# Patient Record
Sex: Female | Born: 1950 | Race: Black or African American | Hispanic: No | Marital: Single | Smoking: Current every day smoker
Health system: Southern US, Community
[De-identification: ages and names within clinical notes are randomized; demographics above are authoritative.]

## PROBLEM LIST (undated history)

## (undated) DIAGNOSIS — J45909 Unspecified asthma, uncomplicated: Secondary | ICD-10-CM

## (undated) DIAGNOSIS — K5900 Constipation, unspecified: Secondary | ICD-10-CM

## (undated) DIAGNOSIS — M810 Age-related osteoporosis without current pathological fracture: Secondary | ICD-10-CM

## (undated) DIAGNOSIS — Z973 Presence of spectacles and contact lenses: Secondary | ICD-10-CM

## (undated) DIAGNOSIS — M1990 Unspecified osteoarthritis, unspecified site: Secondary | ICD-10-CM

## (undated) DIAGNOSIS — H269 Unspecified cataract: Secondary | ICD-10-CM

## (undated) DIAGNOSIS — M797 Fibromyalgia: Secondary | ICD-10-CM

## (undated) DIAGNOSIS — K219 Gastro-esophageal reflux disease without esophagitis: Secondary | ICD-10-CM

## (undated) DIAGNOSIS — K635 Polyp of colon: Secondary | ICD-10-CM

## (undated) DIAGNOSIS — G43909 Migraine, unspecified, not intractable, without status migrainosus: Secondary | ICD-10-CM

## (undated) DIAGNOSIS — T7840XA Allergy, unspecified, initial encounter: Secondary | ICD-10-CM

## (undated) DIAGNOSIS — J449 Chronic obstructive pulmonary disease, unspecified: Secondary | ICD-10-CM

## (undated) DIAGNOSIS — I10 Essential (primary) hypertension: Secondary | ICD-10-CM

## (undated) DIAGNOSIS — T148XXA Other injury of unspecified body region, initial encounter: Secondary | ICD-10-CM

## (undated) HISTORY — DX: Migraine, unspecified, not intractable, without status migrainosus: G43.909

## (undated) HISTORY — PX: BREAST EXCISIONAL BIOPSY: SUR124

## (undated) HISTORY — DX: Chronic obstructive pulmonary disease, unspecified: J44.9

## (undated) HISTORY — PX: COLONOSCOPY: SHX174

## (undated) HISTORY — PX: TUBAL LIGATION: SHX77

## (undated) HISTORY — PX: CYST REMOVAL HAND: SHX6279

## (undated) HISTORY — DX: Unspecified osteoarthritis, unspecified site: M19.90

## (undated) HISTORY — DX: Fibromyalgia: M79.7

## (undated) HISTORY — PX: TONSILLECTOMY AND ADENOIDECTOMY: SHX28

## (undated) HISTORY — PX: COLONOSCOPY W/ BIOPSIES AND POLYPECTOMY: SHX1376

## (undated) HISTORY — PX: GALLBLADDER SURGERY: SHX652

## (undated) HISTORY — DX: Gastro-esophageal reflux disease without esophagitis: K21.9

## (undated) HISTORY — DX: Unspecified asthma, uncomplicated: J45.909

## (undated) HISTORY — DX: Essential (primary) hypertension: I10

## (undated) HISTORY — DX: Allergy, unspecified, initial encounter: T78.40XA

## (undated) HISTORY — DX: Polyp of colon: K63.5

## (undated) HISTORY — DX: Age-related osteoporosis without current pathological fracture: M81.0

## (undated) HISTORY — PX: CHOLECYSTECTOMY: SHX55

## (undated) HISTORY — PX: POLYPECTOMY: SHX149

## (undated) HISTORY — PX: WISDOM TOOTH EXTRACTION: SHX21

---

## 2014-12-30 ENCOUNTER — Other Ambulatory Visit: Payer: Self-pay

## 2014-12-30 DIAGNOSIS — Z1231 Encounter for screening mammogram for malignant neoplasm of breast: Secondary | ICD-10-CM

## 2015-02-08 ENCOUNTER — Ambulatory Visit
Admission: RE | Admit: 2015-02-08 | Discharge: 2015-02-08 | Disposition: A | Discharge status: Home / Self Care | Payer: Commercial Managed Care - HMO | Source: Ambulatory Visit

## 2015-02-08 DIAGNOSIS — Z1231 Encounter for screening mammogram for malignant neoplasm of breast: Secondary | ICD-10-CM

## 2016-01-17 ENCOUNTER — Other Ambulatory Visit: Payer: Self-pay | Admitting: Internal Medicine

## 2016-01-17 DIAGNOSIS — Z1231 Encounter for screening mammogram for malignant neoplasm of breast: Secondary | ICD-10-CM

## 2016-02-09 ENCOUNTER — Ambulatory Visit
Admission: RE | Admit: 2016-02-09 | Discharge: 2016-02-09 | Disposition: A | Discharge status: Home / Self Care | Payer: Commercial Managed Care - HMO | Source: Ambulatory Visit | Attending: Internal Medicine | Admitting: Internal Medicine

## 2016-02-09 DIAGNOSIS — Z1231 Encounter for screening mammogram for malignant neoplasm of breast: Secondary | ICD-10-CM

## 2016-04-12 DIAGNOSIS — M25532 Pain in left wrist: Secondary | ICD-10-CM | POA: Diagnosis not present

## 2016-04-13 ENCOUNTER — Encounter: Payer: Medicare Other | Attending: Physical Medicine & Rehabilitation | Admitting: Physical Medicine & Rehabilitation

## 2016-04-13 ENCOUNTER — Encounter: Payer: Self-pay | Admitting: Physical Medicine & Rehabilitation

## 2016-04-13 VITALS — BP 108/72 | HR 100 | Temp 98.3°F | Resp 14

## 2016-04-13 DIAGNOSIS — R269 Unspecified abnormalities of gait and mobility: Secondary | ICD-10-CM | POA: Diagnosis not present

## 2016-04-13 DIAGNOSIS — G479 Sleep disorder, unspecified: Secondary | ICD-10-CM | POA: Diagnosis not present

## 2016-04-13 DIAGNOSIS — G8929 Other chronic pain: Secondary | ICD-10-CM | POA: Diagnosis not present

## 2016-04-13 DIAGNOSIS — Z79899 Other long term (current) drug therapy: Secondary | ICD-10-CM | POA: Diagnosis not present

## 2016-04-13 DIAGNOSIS — F17200 Nicotine dependence, unspecified, uncomplicated: Secondary | ICD-10-CM | POA: Insufficient documentation

## 2016-04-13 DIAGNOSIS — Z5181 Encounter for therapeutic drug level monitoring: Secondary | ICD-10-CM

## 2016-04-13 DIAGNOSIS — M791 Myalgia, unspecified site: Secondary | ICD-10-CM

## 2016-04-13 DIAGNOSIS — M545 Low back pain: Secondary | ICD-10-CM | POA: Diagnosis not present

## 2016-04-13 DIAGNOSIS — W19XXXA Unspecified fall, initial encounter: Secondary | ICD-10-CM

## 2016-04-13 DIAGNOSIS — M797 Fibromyalgia: Secondary | ICD-10-CM | POA: Diagnosis not present

## 2016-04-13 DIAGNOSIS — Z720 Tobacco use: Secondary | ICD-10-CM

## 2016-04-13 LAB — CBC WITH DIFFERENTIAL/PLATELET
BASOS ABS: 0 10*3/uL (ref 0.0–0.2)
Basos: 0 %
EOS (ABSOLUTE): 0.3 10*3/uL (ref 0.0–0.4)
Eos: 3 %
Hematocrit: 45.6 % (ref 34.0–46.6)
Hemoglobin: 15.9 g/dL (ref 11.1–15.9)
LYMPHS ABS: 3.6 10*3/uL — AB (ref 0.7–3.1)
LYMPHS: 33 %
MCH: 31.2 pg (ref 26.6–33.0)
MCHC: 34.9 g/dL (ref 31.5–35.7)
MCV: 89 fL (ref 79–97)
Monocytes Absolute: 0.8 10*3/uL (ref 0.1–0.9)
Monocytes: 7 %
NEUTROS ABS: 6.2 10*3/uL (ref 1.4–7.0)
Neutrophils: 57 %
PLATELETS: 244 10*3/uL (ref 150–379)
RBC: 5.1 x10E6/uL (ref 3.77–5.28)
RDW: 13.2 % (ref 12.3–15.4)
WBC: 10.9 10*3/uL — AB (ref 3.4–10.8)

## 2016-04-13 MED ORDER — BACLOFEN 20 MG PO TABS: 20.0000 mg | ORAL_TABLET | Freq: Three times a day (TID) | ORAL | 0 refills | Status: DC

## 2016-04-13 MED ORDER — DULOXETINE HCL 30 MG PO CPEP: 30.0000 mg | ORAL_CAPSULE | Freq: Every day | ORAL | 1 refills | Status: DC

## 2016-04-13 NOTE — Progress Notes (Signed)
Subjective:    Patient ID: Danielle Clay, female    DOB: 12/24/1950, 66 y.o.   MRN: [id number]  HPI 66 y/o female with pmh of OA, fibromyalgia, asthma, COPD, HTN, chronic pain presents with pain all over >> low back.  Started ~2007, getting progressively worse.  Denies inciting event.  Rest improves the pain.  Activity exacerbates the pain.  Sharp pain.  Usually non-radiating.  Mediations do not work. She has associated weakness.  Pain prevents her mobility and sleep.  She has had several falls in the last year.   Pain Inventory Average Pain 8 Pain Right Now 8 My pain is sharp, tingling and aching  In the last 24 hours, has pain interfered with the following? General activity 7 Relation with others 0 Enjoyment of life 8 What TIME of day is your pain at its worst? morning, daytime Sleep (in general) Poor  Pain is worse with: walking, bending, sitting, standing and some activites Pain improves with: n/a Relief from Meds: 2  Mobility walk without assistance use a cane ability to climb steps?  no do you drive?  no Do you have any goals in this area?  yes  Function retired Do you have any goals in this area?  no  Neuro/Psych bladder control problems bowel control problems weakness numbness tremor trouble walking spasms dizziness confusion depression loss of taste or smell  Prior Studies Any changes since last visit?  no  Physicians involved in your care Any changes since last visit?  no   Family History  Problem Relation Age of Onset  . Diabetes Mother   . Anemia Mother   . HIV Sister   . Heart Problems Brother   . Asthma Brother   . HIV Brother   . HIV Brother    Social History   Social History  . Marital status: Unknown    Spouse name: N/A  . Number of children: N/A  . Years of education: N/A   Social History Main Topics  . Smoking status: Current Some Day Smoker  . Smokeless tobacco: Never Used  . Alcohol use No     Comment: "once or twice  a year wine"  . Drug use: No  . Sexual activity: No   Other Topics Concern  . None   Social History Narrative  . None   Past Surgical History:  Procedure Laterality Date  . CYST REMOVAL HAND     Under left breast  . GALLBLADDER SURGERY    . TONSILLECTOMY AND ADENOIDECTOMY     Past Medical History:  Diagnosis Date  . Allergy   . Arthritis   . Hypertension    BP 108/72   Pulse 100   Temp 98.3 F (36.8 C) (Oral)   Resp 14   SpO2 93%   Opioid Risk Score:   Fall Risk Score:  `1  Depression screen PHQ 2/9  Depression screen PHQ 2/9 04/13/2016  Decreased Interest 3  Down, Depressed, Hopeless 3  PHQ - 2 Score 6  Altered sleeping 3  Tired, decreased energy 3  Change in appetite 2  Feeling bad or failure about yourself  1  Trouble concentrating 3  Moving slowly or fidgety/restless 0  Suicidal thoughts 0  PHQ-9 Score 18     Review of Systems  Constitutional: Positive for appetite change.       Night sweats  HENT: Negative.   Eyes: Negative.   Respiratory: Positive for wheezing.        Respiratory infection  Gastrointestinal: Positive for constipation and nausea.  Endocrine: Negative.   Genitourinary: Negative.   Musculoskeletal: Negative.        Limb swelling   Allergic/Immunologic: Negative.   Neurological: Negative.   Hematological: Negative.   Psychiatric/Behavioral: Negative.   All other systems reviewed and are negative.      Objective:   Physical Exam Gen: NAD. Vital signs reviewed HENT: Normocephalic, Atraumatic Eyes: EOMI, No discharge.  Cardio: RRR. No JVD.  Pulm: B/l clear to auscultation.  Effort normal Abd: Soft, BS+ MSK:  Gait mild difficulty with balance.   TTP lumbar PSPs and gluteal muscles.  No edema.   FABERs limited due to pain with ROM.  Neuro: CN II-XII grossly intact.    Sensation intact to light touch in all LE dermatomes  Reflexes 2+ throughout  Strength  5/5 in all LE myotomes (slightly limited due to pain  inhibition)  Neg SLR Skin: Warm and Dry    Assessment & Plan:  66 y/o female with pmh of OA, fibromyalgia, asthma, COPD, HTN, chronic pain presents with pain all over >> low back.    1. Chronic low back pain  Referrals information reviewed  Meds reviewed  NCCSRS reviewed  States she had PT without benefit.  She is not doing HEP.  No Xrays on file, L-spine films ordered  Will order TENS unit  Will order Cymbalta 30mg , consider increase to 60mg  on next visit  Will increase Baclofen, consider other muscle relaxers if not effective  Will order labs to evaluate kidney/liver function  Will explore pool therapy    2. Fibromyalgia  Will order Cymbalta 30mg , consider increase to 60mg  on next visit  3. Gait abnormality with several falls resulting in fracture  Cont cane for safety  Will d/c Gabapentin as upon further investigation, pt states falls increased after increase in pills  4. Sleep disturbance  Cont trazodone per PCP  5. Myalgia  Will consider trigger point injections in future - pt afraid of needles  6. Tobacco abuse  Pt attempting to quit

## 2016-04-13 NOTE — Addendum Note (Signed)
Addended by: Marland Mcalpine B on: 04/13/2016 02:35 PM   Modules accepted: Orders

## 2016-04-14 LAB — COMPREHENSIVE METABOLIC PANEL
A/G RATIO: 1.4 (ref 1.2–2.2)
ALT: 17 IU/L (ref 0–32)
AST: 17 IU/L (ref 0–40)
Albumin: 4.2 g/dL (ref 3.6–4.8)
Alkaline Phosphatase: 71 IU/L (ref 39–117)
BUN/Creatinine Ratio: 21 (ref 12–28)
BUN: 12 mg/dL (ref 8–27)
Bilirubin Total: 0.5 mg/dL (ref 0.0–1.2)
CALCIUM: 10.6 mg/dL — AB (ref 8.7–10.3)
CO2: 26 mmol/L (ref 18–29)
Chloride: 99 mmol/L (ref 96–106)
Creatinine, Ser: 0.57 mg/dL (ref 0.57–1.00)
GFR, EST AFRICAN AMERICAN: 112 mL/min/{1.73_m2} (ref >59)
GFR, EST NON AFRICAN AMERICAN: 97 mL/min/{1.73_m2} (ref >59)
Globulin, Total: 3 g/dL (ref 1.5–4.5)
Glucose: 120 mg/dL — ABNORMAL HIGH (ref 65–99)
POTASSIUM: 4.7 mmol/L (ref 3.5–5.2)
Sodium: 140 mmol/L (ref 134–144)
TOTAL PROTEIN: 7.2 g/dL (ref 6.0–8.5)

## 2016-04-20 LAB — TOXASSURE SELECT,+ANTIDEPR,UR

## 2016-04-25 ENCOUNTER — Telehealth: Payer: Self-pay

## 2016-04-25 NOTE — Progress Notes (Signed)
Patient Urine drug screen came back Inconsistent. Urine Sample contained EtG and EtS, which indicates the presences of alcohol. Please see lab report for details and advise.  

## 2016-04-25 NOTE — Telephone Encounter (Signed)
Patient Urine drug screen came back Inconsistent. Urine Sample contained EtG and EtS, which indicates the presences of alcohol. Please see lab report for details and advise.  

## 2016-04-28 DIAGNOSIS — R296 Repeated falls: Secondary | ICD-10-CM | POA: Diagnosis not present

## 2016-04-28 DIAGNOSIS — R51 Headache: Secondary | ICD-10-CM | POA: Diagnosis not present

## 2016-04-28 DIAGNOSIS — R42 Dizziness and giddiness: Secondary | ICD-10-CM | POA: Diagnosis not present

## 2016-05-10 DIAGNOSIS — M25532 Pain in left wrist: Secondary | ICD-10-CM | POA: Diagnosis not present

## 2016-05-11 ENCOUNTER — Encounter: Payer: Medicare Other | Admitting: Physical Medicine & Rehabilitation

## 2016-05-15 ENCOUNTER — Other Ambulatory Visit: Payer: Self-pay | Admitting: Nurse Practitioner

## 2016-05-15 DIAGNOSIS — R42 Dizziness and giddiness: Secondary | ICD-10-CM

## 2016-05-15 DIAGNOSIS — R296 Repeated falls: Secondary | ICD-10-CM

## 2016-05-17 ENCOUNTER — Telehealth: Payer: Self-pay

## 2016-05-17 NOTE — Telephone Encounter (Signed)
Patient called this morning requesting help with moving prescriptions onto a mail order program.

## 2016-05-18 ENCOUNTER — Ambulatory Visit
Admission: RE | Admit: 2016-05-18 | Discharge: 2016-05-18 | Disposition: A | Discharge status: Home / Self Care | Payer: Medicare Other | Source: Ambulatory Visit | Attending: Physical Medicine & Rehabilitation | Admitting: Physical Medicine & Rehabilitation

## 2016-05-18 DIAGNOSIS — M47816 Spondylosis without myelopathy or radiculopathy, lumbar region: Secondary | ICD-10-CM | POA: Diagnosis not present

## 2016-05-18 DIAGNOSIS — G8929 Other chronic pain: Secondary | ICD-10-CM

## 2016-05-18 DIAGNOSIS — M545 Low back pain, unspecified: Secondary | ICD-10-CM

## 2016-05-18 NOTE — Telephone Encounter (Signed)
Called new pharmacy and delivered the prescriptions by phone, contacted patient and informed her of new delivery of medications

## 2016-05-22 ENCOUNTER — Ambulatory Visit
Admission: RE | Admit: 2016-05-22 | Discharge: 2016-05-22 | Disposition: A | Discharge status: Home / Self Care | Payer: Medicare Other | Source: Ambulatory Visit | Attending: Nurse Practitioner | Admitting: Nurse Practitioner

## 2016-05-22 ENCOUNTER — Inpatient Hospital Stay: Admission: RE | Admit: 2016-05-22 | Payer: Medicare Other | Source: Ambulatory Visit

## 2016-05-22 DIAGNOSIS — R42 Dizziness and giddiness: Secondary | ICD-10-CM | POA: Diagnosis not present

## 2016-05-22 DIAGNOSIS — R51 Headache: Secondary | ICD-10-CM | POA: Diagnosis not present

## 2016-05-22 DIAGNOSIS — R296 Repeated falls: Secondary | ICD-10-CM

## 2016-05-22 MED ORDER — IOPAMIDOL (ISOVUE-300) INJECTION 61%
75.0000 mL | Freq: Once | INTRAVENOUS | Status: AC | PRN
  Administered 2016-05-22: 75 mL via INTRAVENOUS

## 2016-05-23 ENCOUNTER — Telehealth: Payer: Self-pay

## 2016-05-23 NOTE — Telephone Encounter (Signed)
Patient called today asking if she could increase the amounts of her prescriptions of baclofen and Cymbalta to 90 days supplies, was informed that dr Posey Pronto recommended she stay with the current amounts of her meds until her follow up appointment as this is a trial on these medications to see if this is the best course of action for this patient

## 2016-05-26 ENCOUNTER — Ambulatory Visit: Payer: Medicare Other | Admitting: Physical Medicine & Rehabilitation

## 2016-06-07 DIAGNOSIS — S52602D Unspecified fracture of lower end of left ulna, subsequent encounter for closed fracture with routine healing: Secondary | ICD-10-CM | POA: Diagnosis not present

## 2016-06-08 ENCOUNTER — Other Ambulatory Visit: Payer: Self-pay

## 2016-06-08 ENCOUNTER — Encounter: Payer: Medicare Other | Attending: Physical Medicine & Rehabilitation | Admitting: Physical Medicine & Rehabilitation

## 2016-06-08 DIAGNOSIS — G8929 Other chronic pain: Secondary | ICD-10-CM | POA: Diagnosis not present

## 2016-06-08 DIAGNOSIS — Z720 Tobacco use: Secondary | ICD-10-CM | POA: Diagnosis not present

## 2016-06-08 DIAGNOSIS — M545 Low back pain, unspecified: Secondary | ICD-10-CM

## 2016-06-08 DIAGNOSIS — Z79899 Other long term (current) drug therapy: Secondary | ICD-10-CM | POA: Diagnosis not present

## 2016-06-08 DIAGNOSIS — M797 Fibromyalgia: Secondary | ICD-10-CM | POA: Diagnosis not present

## 2016-06-08 DIAGNOSIS — R269 Unspecified abnormalities of gait and mobility: Secondary | ICD-10-CM | POA: Insufficient documentation

## 2016-06-08 DIAGNOSIS — G479 Sleep disorder, unspecified: Secondary | ICD-10-CM | POA: Diagnosis not present

## 2016-06-08 DIAGNOSIS — F17200 Nicotine dependence, unspecified, uncomplicated: Secondary | ICD-10-CM | POA: Insufficient documentation

## 2016-06-08 DIAGNOSIS — Z5181 Encounter for therapeutic drug level monitoring: Secondary | ICD-10-CM | POA: Insufficient documentation

## 2016-06-08 DIAGNOSIS — W19XXXD Unspecified fall, subsequent encounter: Secondary | ICD-10-CM

## 2016-06-08 DIAGNOSIS — M791 Myalgia, unspecified site: Secondary | ICD-10-CM

## 2016-06-08 MED ORDER — BACLOFEN 20 MG PO TABS: 30.0000 mg | ORAL_TABLET | Freq: Three times a day (TID) | ORAL | 1 refills | Status: AC

## 2016-06-08 MED ORDER — DULOXETINE HCL 60 MG PO CPEP: 60.0000 mg | ORAL_CAPSULE | Freq: Every day | ORAL | 1 refills | Status: DC

## 2016-06-08 NOTE — Progress Notes (Addendum)
Subjective:    Patient ID: Danielle Clay, female    DOB: [birth year], 66 y.o.   MRN: [id number]  HPI 66 y/o female with pmh of OA, fibromyalgia, asthma, COPD, HTN, chronic pain presents for follow up of pain all over >> low back.  Initially stated: Started ~2007, getting progressively worse.  Denies inciting event.  Rest improves the pain.  Activity exacerbates the pain.  Sharp pain.  Usually non-radiating.  Mediations do not work. She has associated weakness.  Pain prevents her mobility and sleep.  She has had several falls in the last year.   Last clinic visit 04/13/16.  She states she is not doing HEP.  She obtained her L-spine films. She states she lost the prescription for the TENS.  Cymbalta is helping.  Baclofen is helping.  She obtained labs.  She did not explore pool therapy. She is now smoking 2-3 cigarettes/day. Since last visit, she states she fell and is scheduled to have an MRI due to continued headache.   Pain Inventory Average Pain 9 Pain Right Now 9 My pain is constant, sharp and aching  In the last 24 hours, has pain interfered with the following? General activity 7 Relation with others 0 Enjoyment of life 6 What TIME of day is your pain at its worst? all Sleep (in general) Poor  Pain is worse with: walking, bending, sitting, standing and some activites Pain improves with: other Relief from Meds: 4  Mobility walk without assistance use a cane ability to climb steps?  yes do you drive?  no  Function retired I need assistance with the following:  household duties  Neuro/Psych bladder control problems tremor spasms dizziness confusion depression loss of taste or smell  Prior Studies Any changes since last visit?  no  Physicians involved in your care Any changes since last visit?  no   Family History  Problem Relation Age of Onset  . Diabetes Mother   . Anemia Mother   . HIV Sister   . Heart Problems Brother   . Asthma Brother   . HIV Brother    . HIV Brother    Social History   Social History  . Marital status: Unknown    Spouse name: N/A  . Number of children: N/A  . Years of education: N/A   Social History Main Topics  . Smoking status: Current Some Day Smoker  . Smokeless tobacco: Never Used  . Alcohol use No     Comment: "once or twice a year wine"  . Drug use: No  . Sexual activity: No   Other Topics Concern  . Not on file   Social History Narrative  . No narrative on file   Past Surgical History:  Procedure Laterality Date  . CYST REMOVAL HAND     Under left breast  . GALLBLADDER SURGERY    . TONSILLECTOMY AND ADENOIDECTOMY     Past Medical History:  Diagnosis Date  . Allergy   . Arthritis   . Hypertension    There were no vitals taken for this visit.  Opioid Risk Score:   Fall Risk Score:  `1  Depression screen PHQ 2/9  Depression screen [facility] 2/9 06/08/2016 04/13/2016  Decreased Interest 3 3  Down, Depressed, Hopeless 3 3  PHQ - 2 Score 6 6  Altered sleeping - 3  Tired, decreased energy - 3  Change in appetite - 2  Feeling bad or failure about yourself  - 1  Trouble concentrating - 3  Moving slowly or fidgety/restless - 0  Suicidal thoughts - 0  PHQ-9 Score - 18     Review of Systems  Constitutional: Positive for appetite change.       Night sweats  HENT: Negative.   Eyes: Negative.   Respiratory: Negative.        Respiratory infection   Cardiovascular: Positive for leg swelling.  Gastrointestinal: Positive for abdominal pain, constipation, diarrhea and nausea.  Endocrine: Negative.   Genitourinary: Negative.   Musculoskeletal: Negative.        Limb swelling   Skin: Negative.   Allergic/Immunologic: Negative.   Neurological: Positive for dizziness and tremors.  Hematological: Negative.   Psychiatric/Behavioral: Positive for confusion and dysphoric mood. The patient is nervous/anxious.   All other systems reviewed and are negative.      Objective:   Physical Exam Gen:  NAD. Vital signs reviewed HENT: Normocephalic, +Traumatic Eyes: EOMI, No discharge.  Cardio: RRR. No JVD.  Pulm: B/l clear to auscultation.  Effort normal Abd: Soft, BS+ MSK:  Gait mild antalgic.   TTP lumbar PSPs and gluteal muscles.  No edema.  Neuro:    Sensation intact to light touch in all LE dermatomes  Strength  5/5 in all LE myotomes (slightly limited due to pain inhibition) Skin: Warm and Dry. Intact    Assessment & Plan:  66 y/o female with pmh of OA, fibromyalgia, asthma, COPD, HTN, chronic pain presents with pain all over >> low back.    1. Chronic low back pain  NCCSRS reviewed  States she had PT without benefit.  She is not doing HEP.  L-spine films reviewed showing multilevel degenerative changes and anterolisthesis L4-5  Labs reviewed, relatively unremarkable  Will order TENS unit again, pt states she lost prescription  Will increase Cymbalta to 60mg   Will increase Baclofen to 30 TID  Pt to explore pool therapy (again encouraged)  Will order MRI L-spine  UDS reviewed, showing Etoh    2. Fibromyalgia  Will increase Cymbalta to 60mg   3. Gait abnormality with several falls resulting in fracture  Cont cane for safety, pt states she does need all the time  Will d/c Gabapentin as upon further investigation, pt states falls increased after increase in pills  Pt to have MRI per PCP for fall with persistent headache  4. Sleep disturbance  Cont trazodone per PCP  5. Myalgia  Will consider trigger point injections in future - pt afraid of needles  6. Tobacco abuse  Pt attempting to quit, continued to encourage

## 2016-06-12 ENCOUNTER — Telehealth: Payer: Self-pay | Admitting: Physical Medicine & Rehabilitation

## 2016-06-12 NOTE — Telephone Encounter (Signed)
What medication?  I am only prescribing her Cymbalta and Baclofen, which were both refilled on the last visit.

## 2016-06-12 NOTE — Telephone Encounter (Signed)
Patient called her RX empty dr patel changed dosage and she is out please call her pharmacy at [phone] and advise of changes - her number is [phone]

## 2016-06-13 ENCOUNTER — Encounter: Payer: Self-pay | Admitting: Diagnostic Neuroimaging

## 2016-06-13 ENCOUNTER — Ambulatory Visit (INDEPENDENT_AMBULATORY_CARE_PROVIDER_SITE_OTHER): Payer: Medicare Other | Admitting: Diagnostic Neuroimaging

## 2016-06-13 ENCOUNTER — Telehealth: Payer: Self-pay | Admitting: Diagnostic Neuroimaging

## 2016-06-13 VITALS — BP 139/86 | HR 92 | Ht 64.5 in | Wt 179.6 lb

## 2016-06-13 DIAGNOSIS — R269 Unspecified abnormalities of gait and mobility: Secondary | ICD-10-CM | POA: Diagnosis not present

## 2016-06-13 DIAGNOSIS — G44329 Chronic post-traumatic headache, not intractable: Secondary | ICD-10-CM | POA: Diagnosis not present

## 2016-06-13 DIAGNOSIS — M545 Low back pain, unspecified: Secondary | ICD-10-CM

## 2016-06-13 DIAGNOSIS — G8929 Other chronic pain: Secondary | ICD-10-CM | POA: Diagnosis not present

## 2016-06-13 DIAGNOSIS — H538 Other visual disturbances: Secondary | ICD-10-CM

## 2016-06-13 MED ORDER — ALPRAZOLAM 0.5 MG PO TABS: 0.5000 mg | ORAL_TABLET | ORAL | 0 refills | Status: DC | PRN

## 2016-06-13 NOTE — Telephone Encounter (Signed)
I signed rx. Will give to RN. -VRP

## 2016-06-13 NOTE — Telephone Encounter (Signed)
Xanax Rx faxed to [facility].

## 2016-06-13 NOTE — Progress Notes (Signed)
[facility]  PATIENT: Danielle Clay DOB: [birth year]  REFERRING CLINICIAN: Minette Brine, NP HISTORY FROM: patient  REASON FOR VISIT: new consult    HISTORICAL  CHIEF COMPLAINT:  Chief Complaint  Patient presents with  . Rm 6  . New Patient (Initial Visit)  . Dizziness    Reports vertigo that started sometime in Dec. Feels unbalanced. Hx of hitting her head on concrete in Oct/Nov.  . Headache    Accompanied by headache, blurry vision and nausea. CT showed small optic nerve sheath calcification bilaterally w/o evidence of soft tissue mass. Has eye doctor appt on Friday.    HISTORY OF PRESENT ILLNESS:   66 year old right-handed female here for valuation of dizziness, headaches, vision problems. Patient has history of hypertension, migraine, depression, fibromyalgia, with increasing gait and balance difficulty since October 2017. One time she tripped, fell forward and hit her head and face on concrete steps. Now the time she fell and fractured her left wrist. Another time she fell backwards in her head. Ever since that time she is having a new type of nagging, dull, severe headache, worse with laying down. Also with intermittent blurred vision problems.  Patient also seeing Dr. Posey Pronto for chronic pain management, mainly all over as well as in the low back.  Patient has history of migraine headaches since age 66 years old with pain being throbbing sensation, 1-2 per year, with photophobia, phonophobia and nausea. She was managed over-the-counter medications but never sought medical attention for this.  No other alleviating or aggravating factors.    REVIEW OF SYSTEMS: Full 14 system review of systems performed and negative with exception of: Fevers chills fatigue swelling in legs blurred vision double vision eye pain, feeling hot feeling cold confusion headache slurred speech dizziness tremor sleepiness allergies incontinence hearing loss ringing in ears rash joint  pain joint swelling aching muscles.  ALLERGIES: Allergies  Allergen Reactions  . Penicillins Swelling  . Percocet [Oxycodone-Acetaminophen] Itching    HOME MEDICATIONS: Outpatient Medications Prior to Visit  Medication Sig Dispense Refill  . alendronate (FOSAMAX) 70 MG tablet Take 70 mg by mouth once a week. Take with a full glass of water on an empty stomach.    Marland Kitchen amLODipine (NORVASC) 10 MG tablet Take 10 mg by mouth daily.    . baclofen (LIORESAL) 20 MG tablet Take 1.5 tablets (30 mg total) by mouth 3 (three) times daily. 135 tablet 1  . DULoxetine (CYMBALTA) 60 MG capsule Take 1 capsule (60 mg total) by mouth daily. 30 capsule 1  . Fluticasone-Salmeterol (ADVAIR) 250-50 MCG/DOSE AEPB Inhale 1 puff into the lungs 2 (two) times daily.    Marland Kitchen gabapentin (NEURONTIN) 300 MG capsule Take 300 mg by mouth 2 (two) times daily.    . montelukast (SINGULAIR) 10 MG tablet Take 10 mg by mouth at bedtime.    Marland Kitchen omeprazole (PRILOSEC) 10 MG capsule Take 40 mg by mouth daily.    Marland Kitchen tiotropium (SPIRIVA HANDIHALER) 18 MCG inhalation capsule Place 18 mcg into inhaler and inhale daily.    . traZODone (DESYREL) 50 MG tablet Take 50 mg by mouth at bedtime.    . triamcinolone cream (KENALOG) 0.1 % Apply 1 application topically 2 (two) times daily.     No facility-administered medications prior to visit.     PAST MEDICAL HISTORY: Past Medical History:  Diagnosis Date  . Allergy   . Arthritis   . Bronchial asthma   . COPD (chronic obstructive pulmonary disease) ([place])   .  Fibromyalgia   . Hypertension   . Migraine     PAST SURGICAL HISTORY: Past Surgical History:  Procedure Laterality Date  . CYST REMOVAL HAND     Under left breast  . GALLBLADDER SURGERY    . TONSILLECTOMY AND ADENOIDECTOMY      FAMILY HISTORY: Family History  Problem Relation Age of Onset  . Diabetes Mother   . Anemia Mother   . HIV Sister   . Heart Problems Brother   . Asthma Brother   . HIV Brother   . HIV Brother      SOCIAL HISTORY:  Social History   Social History  . Marital status: Single    Spouse name: N/A  . Number of children: 3  . Years of education: GED   Occupational History  . N/A    Social History Main Topics  . Smoking status: Current Some Day Smoker  . Smokeless tobacco: Never Used  . Alcohol use No     Comment: "once or twice a year wine"  . Drug use: No  . Sexual activity: No   Other Topics Concern  . Not on file   Social History Narrative   Lives at home alone   Right-handed   Caffeine: tea     PHYSICAL EXAM  GENERAL EXAM/CONSTITUTIONAL: Vitals:  Vitals:   06/13/16 1240  BP: 139/86  Pulse: 92  Weight: 179 lb 9.6 oz (81.5 kg)  Height: 5' 4.5" (1.638 m)     Body mass index is 30.35 kg/m.  No exam data present  Patient is in no distress; well developed, nourished and groomed; neck is supple  CARDIOVASCULAR:  Examination of carotid arteries is normal; no carotid bruits  Regular rate and rhythm, no murmurs  Examination of peripheral vascular system by observation and palpation is normal  EYES:  Ophthalmoscopic exam of optic discs and posterior segments is normal; no papilledema or hemorrhages  MUSCULOSKELETAL:  Gait, strength, tone, movements noted in Neurologic exam below  NEUROLOGIC: MENTAL STATUS:  No flowsheet data found.  awake, alert, oriented to person, place and time  recent and remote memory intact  normal attention and concentration  language fluent, comprehension intact, naming intact,   fund of knowledge appropriate  CRANIAL NERVE:   2nd - no papilledema on fundoscopic exam; PHOTOSENSITIVE  2nd, 3rd, 4th, 6th - pupils equal and reactive to light, visual fields full to confrontation, extraocular muscles intact, no nystagmus  5th - facial sensation symmetric  7th - facial strength symmetric  8th - hearing intact  9th - palate elevates symmetrically, uvula midline  11th - shoulder shrug symmetric  12th -  tongue protrusion midline  MOTOR:   normal bulk and tone, full strength in the BUE, BLE  SENSORY:   normal and symmetric to light touch, temperature, vibration  COORDINATION:   finger-nose-finger, fine finger movements normal  REFLEXES:   deep tendon reflexes present and symmetric  GAIT/STATION:   ANTALGIC GAIT; CAUTIOUS; narrow based gait; romberg is negative    DIAGNOSTIC DATA (LABS, IMAGING, TESTING) - I reviewed patient records, labs, notes, testing and imaging myself where available.  Lab Results  Component Value Date   WBC 10.9 (H) 04/13/2016   HCT 45.6 04/13/2016   MCV 89 04/13/2016   PLT 244 04/13/2016      Component Value Date/Time   NA 140 04/13/2016 1453   K 4.7 04/13/2016 1453   CL 99 04/13/2016 1453   CO2 26 04/13/2016 1453   GLUCOSE 120 (H) 04/13/2016  1453   BUN 12 04/13/2016 1453   CREATININE 0.57 04/13/2016 1453   CALCIUM 10.6 (H) 04/13/2016 1453   PROT 7.2 04/13/2016 1453   ALBUMIN 4.2 04/13/2016 1453   AST 17 04/13/2016 1453   ALT 17 04/13/2016 1453   ALKPHOS 71 04/13/2016 1453   BILITOT 0.5 04/13/2016 1453   GFRNONAA 97 04/13/2016 1453   GFRAA 112 04/13/2016 1453   No results found for: CHOL, HDL, LDLCALC, LDLDIRECT, TRIG, CHOLHDL No results found for: HGBA1C No results found for: VITAMINB12 No results found for: TSH   05/22/16 CT head [I reviewed images myself and agree with interpretation. -VRP]  - Normal CT of the brain with contrast - Small optic nerve sheath calcification bilaterally without evidence of soft tissue mass.     ASSESSMENT AND PLAN  66 y.o. year old female here with history of migraine headaches since age 87 years old, now with new type of headache ever since October 2017 when she had multiple falls and head traumas. Most like represent postconcussion headaches. We'll check MRI of the brain to rule out other secondary causes and check lab testing for neuropathy causes.   Dx:  1. Gait difficulty   2. Chronic  post-traumatic headache, not intractable   3. Blurred vision   4. Chronic bilateral low back pain without sciatica      PLAN:  - check lab testing for neuropathy   - check MRI brain for other causes of headaches, gait diff and blurred vision  - consider physical therapy for gait and balance training  - consider topiramate for migraine headache prevention  - continue fibromyalgia and low back pain treatments per Dr. Posey Pronto  - follow up with eye doctor   Orders Placed This Encounter  Procedures  . MR BRAIN W WO CONTRAST  . Vitamin B12  . Hemoglobin A1c  . TSH   Return in about 2 months (around 08/13/2016).    Penni Bombard, MD 9/50/9326, 71:24 PM Certified in Neurology, Neurophysiology and Neuroimaging  [facility] [address], [place], [place] [address] [phone]

## 2016-06-13 NOTE — Patient Instructions (Signed)
Thank you for coming to see [place] at [facility]. I hope we have been able to provide you high quality care today.  You may receive a patient satisfaction survey over the next few weeks. We would appreciate your feedback and comments so that we may continue to improve ourselves and the health of our patients.  - check lab testing today  - check MRI brain for other causes of headaches, gait diff and blurred vision  - consider physical therapy for gait and balance training  - consider topiramate for migraine headache prevention  - continue fibromyalgia and low back pain treatments per Dr. Posey Pronto  - follow up with eye doctor    ~~~~~~~~~~~~~~~~~~~~~~~~~~~~~~~~~~~~~~~~~~~~~~~~~~~~~~~~~~~~~~~~~  DR. Berman Grainger'S GUIDE TO HAPPY AND HEALTHY LIVING These are some of my general health and wellness recommendations. Some of them may apply to you better than others. Please use common sense as you try these suggestions and feel free to ask me any questions.   ACTIVITY/FITNESS Mental, social, emotional and physical stimulation are very important for brain and body health. Try learning a new activity (arts, music, language, sports, games).  Keep moving your body to the best of your abilities. You can do this at home, inside or outside, the park, community center, gym or anywhere you like. Consider a physical therapist or personal trainer to get started. Consider the app Sworkit. Fitness trackers such as smart-watches, smart-phones or Fitbits can help as well.   NUTRITION Eat more plants: colorful vegetables, nuts, seeds and berries.  Eat less sugar, salt, preservatives and processed foods.  Avoid toxins such as cigarettes and alcohol.  Drink water when you are thirsty. Warm water with a slice of lemon is an excellent morning drink to start the day.  Consider these websites for more information The Nutrition Source (https://www.henry-hernandez.biz/) Precision  Nutrition (WindowBlog.ch)   RELAXATION Consider practicing mindfulness meditation or other relaxation techniques such as deep breathing, prayer, yoga, tai chi, massage. See website mindful.org or the apps Headspace or Calm to help get started.   SLEEP Try to get at least 7-8+ hours sleep per day. Regular exercise and reduced caffeine will help you sleep better. Practice good sleep hygeine techniques. See website sleep.org for more information.   PLANNING Prepare estate planning, living will, healthcare POA documents. Sometimes this is best planned with the help of an attorney. Theconversationproject.org and agingwithdignity.org are excellent resources.

## 2016-06-13 NOTE — Telephone Encounter (Signed)
Pt request anxiety med to take prior to MRI.

## 2016-06-14 ENCOUNTER — Telehealth: Payer: Self-pay | Admitting: *Deleted

## 2016-06-14 LAB — HEMOGLOBIN A1C
ESTIMATED AVERAGE GLUCOSE: 108 mg/dL
HEMOGLOBIN A1C: 5.4 % (ref 4.8–5.6)

## 2016-06-14 LAB — TSH: TSH: 1.21 u[IU]/mL (ref 0.450–4.500)

## 2016-06-14 LAB — VITAMIN B12: Vitamin B-12: 684 pg/mL (ref 232–1245)

## 2016-06-14 NOTE — Telephone Encounter (Signed)
Verifying the dosage of Cymbalta was increased to 60 mg.  Verification given.

## 2016-06-14 NOTE — Telephone Encounter (Signed)
Spoke with patient and informed her that her lab results are normal. She stated she has MRI on 07/11/16. Advised she will get a call with those results when available. She verbalized understanding, had no questions.

## 2016-06-16 DIAGNOSIS — Z83511 Family history of glaucoma: Secondary | ICD-10-CM | POA: Diagnosis not present

## 2016-06-16 DIAGNOSIS — H2513 Age-related nuclear cataract, bilateral: Secondary | ICD-10-CM | POA: Diagnosis not present

## 2016-06-16 DIAGNOSIS — H40013 Open angle with borderline findings, low risk, bilateral: Secondary | ICD-10-CM | POA: Diagnosis not present

## 2016-06-16 DIAGNOSIS — H40053 Ocular hypertension, bilateral: Secondary | ICD-10-CM | POA: Diagnosis not present

## 2016-06-19 DIAGNOSIS — H9201 Otalgia, right ear: Secondary | ICD-10-CM | POA: Diagnosis not present

## 2016-06-19 DIAGNOSIS — Q845 Enlarged and hypertrophic nails: Secondary | ICD-10-CM | POA: Diagnosis not present

## 2016-07-05 ENCOUNTER — Ambulatory Visit (HOSPITAL_COMMUNITY)
Admission: RE | Admit: 2016-07-05 | Discharge: 2016-07-05 | Disposition: A | Discharge status: Home / Self Care | Payer: Medicare Other | Source: Ambulatory Visit | Attending: Diagnostic Neuroimaging | Admitting: Diagnostic Neuroimaging

## 2016-07-05 ENCOUNTER — Ambulatory Visit (HOSPITAL_COMMUNITY)
Admission: RE | Admit: 2016-07-05 | Discharge: 2016-07-05 | Disposition: A | Discharge status: Home / Self Care | Payer: Medicare Other | Source: Ambulatory Visit | Attending: Physical Medicine & Rehabilitation | Admitting: Physical Medicine & Rehabilitation

## 2016-07-05 DIAGNOSIS — G8929 Other chronic pain: Secondary | ICD-10-CM | POA: Insufficient documentation

## 2016-07-05 DIAGNOSIS — H538 Other visual disturbances: Secondary | ICD-10-CM

## 2016-07-05 DIAGNOSIS — M545 Low back pain, unspecified: Secondary | ICD-10-CM

## 2016-07-05 DIAGNOSIS — G44329 Chronic post-traumatic headache, not intractable: Secondary | ICD-10-CM

## 2016-07-05 DIAGNOSIS — M47897 Other spondylosis, lumbosacral region: Secondary | ICD-10-CM | POA: Diagnosis not present

## 2016-07-05 DIAGNOSIS — D259 Leiomyoma of uterus, unspecified: Secondary | ICD-10-CM | POA: Diagnosis not present

## 2016-07-05 DIAGNOSIS — M47896 Other spondylosis, lumbar region: Secondary | ICD-10-CM | POA: Diagnosis not present

## 2016-07-05 DIAGNOSIS — G44309 Post-traumatic headache, unspecified, not intractable: Secondary | ICD-10-CM | POA: Diagnosis not present

## 2016-07-05 DIAGNOSIS — R269 Unspecified abnormalities of gait and mobility: Secondary | ICD-10-CM | POA: Diagnosis not present

## 2016-07-05 DIAGNOSIS — M48061 Spinal stenosis, lumbar region without neurogenic claudication: Secondary | ICD-10-CM | POA: Insufficient documentation

## 2016-07-05 LAB — CREATININE, SERUM: CREATININE: 0.64 mg/dL (ref 0.44–1.00)

## 2016-07-05 MED ORDER — GADOBENATE DIMEGLUMINE 529 MG/ML IV SOLN
17.0000 mL | Freq: Once | INTRAVENOUS | Status: AC | PRN
  Administered 2016-07-05: 17 mL via INTRAVENOUS

## 2016-07-06 ENCOUNTER — Encounter: Payer: Medicare Other | Attending: Physical Medicine & Rehabilitation | Admitting: Physical Medicine & Rehabilitation

## 2016-07-06 ENCOUNTER — Encounter: Payer: Self-pay | Admitting: Physical Medicine & Rehabilitation

## 2016-07-06 ENCOUNTER — Encounter: Payer: Medicare Other | Admitting: Physical Medicine & Rehabilitation

## 2016-07-06 VITALS — BP 135/80 | HR 94 | Resp 14

## 2016-07-06 DIAGNOSIS — M545 Low back pain, unspecified: Secondary | ICD-10-CM

## 2016-07-06 DIAGNOSIS — G8929 Other chronic pain: Secondary | ICD-10-CM | POA: Diagnosis not present

## 2016-07-06 DIAGNOSIS — Z79899 Other long term (current) drug therapy: Secondary | ICD-10-CM | POA: Insufficient documentation

## 2016-07-06 DIAGNOSIS — G479 Sleep disorder, unspecified: Secondary | ICD-10-CM | POA: Diagnosis not present

## 2016-07-06 DIAGNOSIS — M791 Myalgia, unspecified site: Secondary | ICD-10-CM

## 2016-07-06 DIAGNOSIS — M797 Fibromyalgia: Secondary | ICD-10-CM | POA: Diagnosis not present

## 2016-07-06 DIAGNOSIS — Z720 Tobacco use: Secondary | ICD-10-CM

## 2016-07-06 DIAGNOSIS — M48062 Spinal stenosis, lumbar region with neurogenic claudication: Secondary | ICD-10-CM

## 2016-07-06 DIAGNOSIS — R269 Unspecified abnormalities of gait and mobility: Secondary | ICD-10-CM | POA: Insufficient documentation

## 2016-07-06 DIAGNOSIS — F17200 Nicotine dependence, unspecified, uncomplicated: Secondary | ICD-10-CM | POA: Diagnosis not present

## 2016-07-06 DIAGNOSIS — Z5181 Encounter for therapeutic drug level monitoring: Secondary | ICD-10-CM | POA: Insufficient documentation

## 2016-07-06 MED ORDER — ZOLPIDEM TARTRATE 5 MG PO TABS: 2.5000 mg | ORAL_TABLET | Freq: Every evening | ORAL | 0 refills | Status: DC | PRN

## 2016-07-06 NOTE — Progress Notes (Signed)
Subjective:    Patient ID: Danielle Clay, female    DOB: [birth year], 66 y.o.   MRN: [id number]  HPI 66 y/o female with pmh of OA, fibromyalgia, asthma, COPD, HTN, chronic pain presents for follow up of pain all over >> low back. Initially stated: Started ~2007, getting progressively worse.  Denies inciting event.  Rest improves the pain.  Activity exacerbates the pain.  Sharp pain.  Usually non-radiating.  Mediations do not work. She has associated weakness.  Pain prevents her mobility and sleep.  She has had several falls in the last year.   Last clinic visit 06/08/16. Since last visit, she saw Neurology, who ordered MRI of brain - age related changes.  She still has not obtained the TENS unit. The increase in Cymbalta helped.  The baclofen also helped.  She never followed up on pool therapy.  She did obtain MRI of her back. Denies falls since last visit.  She has been using her cane more consistently now.  She continues to smoke, but is trying to cut back.   Pain Inventory Average Pain 9 Pain Right Now 9 My pain is constant  In the last 24 hours, has pain interfered with the following? General activity 5 Relation with others 5 Enjoyment of life 3 What TIME of day is your pain at its worst? all Sleep (in general) Fair  Pain is worse with: walking, bending, sitting, inactivity, standing and some activites Pain improves with: nothing Relief from Meds: 0  Mobility walk with assistance use a cane ability to climb steps?  no do you drive?  no transfers alone Do you have any goals in this area?  yes  Function employed # of hrs/week . I need assistance with the following:  household duties and shopping Do you have any goals in this area?  yes  Neuro/Psych bladder control problems bowel control problems weakness trouble walking spasms dizziness confusion depression anxiety loss of taste or smell  Prior Studies Any changes since last visit?  no  Physicians involved in  your care Any changes since last visit?  no   Family History  Problem Relation Age of Onset  . Diabetes Mother   . Anemia Mother   . HIV Sister   . Heart Problems Brother   . Asthma Brother   . HIV Brother   . HIV Brother    Social History   Social History  . Marital status: Single    Spouse name: N/A  . Number of children: 3  . Years of education: GED   Occupational History  . N/A    Social History Main Topics  . Smoking status: Current Some Day Smoker  . Smokeless tobacco: Never Used  . Alcohol use No     Comment: "once or twice a year wine"  . Drug use: No  . Sexual activity: No   Other Topics Concern  . None   Social History Narrative   Lives at home alone   Right-handed   Caffeine: tea   Past Surgical History:  Procedure Laterality Date  . CYST REMOVAL HAND     Under left breast  . GALLBLADDER SURGERY    . TONSILLECTOMY AND ADENOIDECTOMY     Past Medical History:  Diagnosis Date  . Allergy   . Arthritis   . Bronchial asthma   . COPD (chronic obstructive pulmonary disease) ([place])   . Fibromyalgia   . Hypertension   . Migraine    BP 135/80 (BP Location: Left  Arm, Patient Position: Sitting, Cuff Size: Normal)   Pulse 94   Resp 14   SpO2 92%   Opioid Risk Score:   Fall Risk Score:  `1  Depression screen PHQ 2/9  Depression screen [facility] 2/9 06/08/2016 04/13/2016  Decreased Interest 3 3  Down, Depressed, Hopeless 3 3  PHQ - 2 Score 6 6  Altered sleeping - 3  Tired, decreased energy - 3  Change in appetite - 2  Feeling bad or failure about yourself  - 1  Trouble concentrating - 3  Moving slowly or fidgety/restless - 0  Suicidal thoughts - 0  PHQ-9 Score - 18    Review of Systems  Constitutional: Positive for appetite change, chills, diaphoresis, fever and unexpected weight change.  Eyes: Negative.   Respiratory: Positive for cough and shortness of breath.   Cardiovascular: Positive for leg swelling.  Gastrointestinal: Positive for  abdominal distention and constipation.  Endocrine: Negative.   Genitourinary: Positive for difficulty urinating.  Musculoskeletal: Positive for arthralgias, back pain, gait problem, joint swelling, myalgias, neck pain and neck stiffness.       Spasms   Skin: Positive for rash.  Neurological: Positive for dizziness.  Hematological: Negative.   Psychiatric/Behavioral: Positive for confusion and dysphoric mood. The patient is nervous/anxious.   All other systems reviewed and are negative.      Objective:   Physical Exam Gen: NAD. Vital signs reviewed HENT: Normocephalic, atraumatic Eyes: EOMI, No discharge.  Cardio: RRR. No JVD.  Pulm: B/l clear to auscultation.  Effort normal Abd: Soft, BS+ MSK:   Gait mild antalgic.   TTP lumbosacral PSPs and gluteal muscles.             No edema.  Neuro:               Strength          5/5 in all LE myotomes (slightly limited due to pain inhibition) Skin: Warm and Dry. Intact    Assessment & Plan:  66 y/o female with pmh of OA, fibromyalgia, asthma, COPD, HTN, chronic pain presents with pain all over >> low back.    1. 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  She is not doing HEP.             L-spine films reviewed showing multilevel degenerative changes and anterolisthesis L4-5, MRI 07/2016 reviewed showing Moderate spinal stenosis at L4-5 >L3-L4 with foraminal narrowing ?nerve root compression L4-5.             Will order TENS unit again  Cont Cymbalta to 60mg   Cont Baclofen to 30 TID             Pt to explore pool therapy (encouraged 3rd time)             UDS reviewed, showing Etoh  Pt is afraid of needles and is fearful on ESIs.  Will refer for injections under sedation.                          2. Fibromyalgia  Cont Cymbalta to 60mg   3. Gait abnormality with several falls resulting in fracture             Cont cane for safety, pt states she does need all the  time  D/ced Gabapentin as pt stated falls increased after increase in pills             Pt to have MRI brain - age related changes.   4. Sleep disturbance  Trazodone d/ced due to lack of efficacy  Will order Ambien 2.5 qhs PRN  5. Myalgia             Will consider trigger point injections in future - pt afraid of needles  6. Tobacco abuse             Pt attempting to quit, continued to encourage

## 2016-07-10 ENCOUNTER — Telehealth: Payer: Self-pay | Admitting: *Deleted

## 2016-07-10 NOTE — Telephone Encounter (Signed)
-----   Message from Penni Bombard, MD sent at 07/07/2016  1:04 PM EDT ----- Unremarkable imaging results. Mild spots of scar tissue. No major findings. Please call patient. Continue current plan. -VRP

## 2016-07-10 NOTE — Telephone Encounter (Signed)
Spoke to pt and relayed that MRI brain was unremarkable (no tumors, strokes).  Did show some mild spots of scar tissue  (causes could be migraine, hypertension, diabetes, high cholesterol).  Continue current plan.  She verbalized understanding.  Was laying down with headache.

## 2016-07-11 ENCOUNTER — Other Ambulatory Visit (HOSPITAL_COMMUNITY): Payer: Medicare Other

## 2016-07-11 ENCOUNTER — Ambulatory Visit (HOSPITAL_COMMUNITY): Payer: Medicare Other

## 2016-07-18 DIAGNOSIS — H903 Sensorineural hearing loss, bilateral: Secondary | ICD-10-CM | POA: Diagnosis not present

## 2016-07-18 DIAGNOSIS — H6983 Other specified disorders of Eustachian tube, bilateral: Secondary | ICD-10-CM | POA: Diagnosis not present

## 2016-07-18 DIAGNOSIS — J310 Chronic rhinitis: Secondary | ICD-10-CM | POA: Diagnosis not present

## 2016-07-18 DIAGNOSIS — H608X3 Other otitis externa, bilateral: Secondary | ICD-10-CM | POA: Diagnosis not present

## 2016-07-18 DIAGNOSIS — J343 Hypertrophy of nasal turbinates: Secondary | ICD-10-CM | POA: Diagnosis not present

## 2016-07-31 ENCOUNTER — Other Ambulatory Visit: Payer: Self-pay | Admitting: Physical Medicine & Rehabilitation

## 2016-07-31 ENCOUNTER — Other Ambulatory Visit: Payer: Self-pay | Admitting: *Deleted

## 2016-07-31 ENCOUNTER — Telehealth: Payer: Self-pay | Admitting: *Deleted

## 2016-07-31 MED ORDER — DULOXETINE HCL 60 MG PO CPEP
60.0000 mg | ORAL_CAPSULE | Freq: Every day | ORAL | 1 refills | Status: DC
Start: 2016-07-31 — End: 2016-07-31

## 2016-07-31 MED ORDER — DULOXETINE HCL 60 MG PO CPEP: 60.0000 mg | ORAL_CAPSULE | Freq: Every day | ORAL | 1 refills | Status: DC

## 2016-07-31 NOTE — Telephone Encounter (Signed)
rec'd fax request for baclofen refill. Not currently on patient's active Rx list, however, last clinic note indicates continuation.  Last script filled was for #30 with the sig stating to take 3 times daily. Can we refill this? And should the pill count be #90 to reflect the sig?

## 2016-08-02 DIAGNOSIS — Z0000 Encounter for general adult medical examination without abnormal findings: Secondary | ICD-10-CM | POA: Diagnosis not present

## 2016-08-02 DIAGNOSIS — M545 Low back pain: Secondary | ICD-10-CM | POA: Diagnosis not present

## 2016-08-02 DIAGNOSIS — E559 Vitamin D deficiency, unspecified: Secondary | ICD-10-CM | POA: Diagnosis not present

## 2016-08-02 DIAGNOSIS — Z6833 Body mass index (BMI) 33.0-33.9, adult: Secondary | ICD-10-CM | POA: Insufficient documentation

## 2016-08-02 DIAGNOSIS — I10 Essential (primary) hypertension: Secondary | ICD-10-CM | POA: Diagnosis not present

## 2016-08-02 DIAGNOSIS — G894 Chronic pain syndrome: Secondary | ICD-10-CM | POA: Diagnosis not present

## 2016-08-02 DIAGNOSIS — R7309 Other abnormal glucose: Secondary | ICD-10-CM | POA: Diagnosis not present

## 2016-08-04 ENCOUNTER — Ambulatory Visit: Payer: Medicare Other | Admitting: Podiatry

## 2016-08-16 ENCOUNTER — Telehealth: Payer: Self-pay | Admitting: *Deleted

## 2016-08-16 NOTE — Telephone Encounter (Signed)
Patient wasn't sure what the call was. I informed her it was probably an appointment reminder

## 2016-08-16 NOTE — Telephone Encounter (Signed)
Patient left message said she is returning our call

## 2016-08-16 NOTE — Telephone Encounter (Signed)
Already dealt with....nevermind

## 2016-08-17 ENCOUNTER — Encounter: Payer: Medicare Other | Attending: Physical Medicine & Rehabilitation | Admitting: Physical Medicine & Rehabilitation

## 2016-08-17 ENCOUNTER — Encounter: Payer: Self-pay | Admitting: Physical Medicine & Rehabilitation

## 2016-08-17 VITALS — BP 129/70 | HR 101 | Resp 14

## 2016-08-17 DIAGNOSIS — Z5181 Encounter for therapeutic drug level monitoring: Secondary | ICD-10-CM | POA: Diagnosis not present

## 2016-08-17 DIAGNOSIS — G8929 Other chronic pain: Secondary | ICD-10-CM | POA: Diagnosis not present

## 2016-08-17 DIAGNOSIS — M545 Low back pain: Secondary | ICD-10-CM | POA: Diagnosis not present

## 2016-08-17 DIAGNOSIS — Z720 Tobacco use: Secondary | ICD-10-CM | POA: Diagnosis not present

## 2016-08-17 DIAGNOSIS — R269 Unspecified abnormalities of gait and mobility: Secondary | ICD-10-CM

## 2016-08-17 DIAGNOSIS — Z79899 Other long term (current) drug therapy: Secondary | ICD-10-CM | POA: Diagnosis not present

## 2016-08-17 DIAGNOSIS — G479 Sleep disorder, unspecified: Secondary | ICD-10-CM

## 2016-08-17 DIAGNOSIS — F17200 Nicotine dependence, unspecified, uncomplicated: Secondary | ICD-10-CM | POA: Insufficient documentation

## 2016-08-17 DIAGNOSIS — M48062 Spinal stenosis, lumbar region with neurogenic claudication: Secondary | ICD-10-CM

## 2016-08-17 DIAGNOSIS — M791 Myalgia, unspecified site: Secondary | ICD-10-CM

## 2016-08-17 DIAGNOSIS — M797 Fibromyalgia: Secondary | ICD-10-CM

## 2016-08-17 NOTE — Progress Notes (Signed)
Subjective:    Patient ID: Danielle Clay, female    DOB: 12/29/1950, 66 y.o.   MRN: [id number]  HPI 66 y/o female with pmh of OA, fibromyalgia, asthma, COPD, HTN, chronic pain presents for follow up of pain all over >> low back. Initially stated: Started ~2007, getting progressively worse.  Denies inciting event.  Rest improves the pain.  Activity exacerbates the pain.  Sharp pain.  Usually non-radiating.  Mediations do not work. She has associated weakness.  Pain prevents her mobility and sleep.  She has had several falls in the last year.   Last clinic visit 07/06/16. Since last visit, she has improved.  [facility]  She states she never obtained a TENS unit.  Continues to take Cymbalta and Baclofen.  She still has not follow up with pool therapy.  She never had ESIs under sedation. Sleep has improved.  She continues to smoke, notes she has good and bad days.    Pain Inventory Average Pain 3 Pain Right Now 3 My pain is intermittent and stabbing  In the last 24 hours, has pain interfered with the following? General activity 3 Relation with others 0 Enjoyment of life 3 What TIME of day is your pain at its worst? varies Sleep (in general) Fair  Pain is worse with: walking, bending, sitting, standing and some activites Pain improves with: medication Relief from Meds: 4  Mobility walk with assistance use a cane ability to climb steps?  no do you drive?  no  Function retired  Neuro/Psych bladder control problems  Prior Studies Any changes since last visit?  no bone scan x-rays CT/MRI  Physicians involved in your care Any changes since last visit?  no   Family History  Problem Relation Age of Onset  . Diabetes Mother   . Anemia Mother   . HIV Sister   . Heart Problems Brother   . Asthma Brother   . HIV Brother   . HIV Brother    Social History   Social History  . Marital status: Single    Spouse name: N/A  . Number  of children: 3  . Years of education: GED   Occupational History  . N/A    Social History Main Topics  . Smoking status: Current Some Day Smoker  . Smokeless tobacco: Never Used  . Alcohol use No     Comment: "once or twice a year wine"  . Drug use: No  . Sexual activity: No   Other Topics Concern  . None   Social History Narrative   Lives at home alone   Right-handed   Caffeine: tea   Past Surgical History:  Procedure Laterality Date  . CYST REMOVAL HAND     Under left breast  . GALLBLADDER SURGERY    . TONSILLECTOMY AND ADENOIDECTOMY     Past Medical History:  Diagnosis Date  . Allergy   . Arthritis   . Bronchial asthma   . COPD (chronic obstructive pulmonary disease) ([place])   . Fibromyalgia   . Hypertension   . Migraine    BP 129/70 (BP Location: Left Arm, Patient Position: Sitting, Cuff Size: Normal)   Pulse (!) 101   Resp 14   SpO2 (!) 89%   Opioid Risk Score:   Fall Risk Score:  `1  Depression screen PHQ 2/9  Depression screen [facility] 2/9 06/08/2016 04/13/2016  Decreased Interest 3 3  Down, Depressed, Hopeless 3 3  PHQ -  2 Score 6 6  Altered sleeping - 3  Tired, decreased energy - 3  Change in appetite - 2  Feeling bad or failure about yourself  - 1  Trouble concentrating - 3  Moving slowly or fidgety/restless - 0  Suicidal thoughts - 0  PHQ-9 Score - 18    Review of Systems  Constitutional: Negative.   HENT: Negative.   Eyes: Negative.   Respiratory: Negative.   Cardiovascular: Negative.   Gastrointestinal: Positive for constipation.  Endocrine: Negative.   Genitourinary: Positive for difficulty urinating.  Musculoskeletal: Positive for arthralgias and back pain.       Spasms   Skin: Positive for rash.  Allergic/Immunologic: Negative.   Hematological: Negative.   Psychiatric/Behavioral: Negative.   All other systems reviewed and are negative.      Objective:   Physical Exam Gen: NAD. Vital signs reviewed HENT: Normocephalic,  atraumatic Eyes: EOMI, No discharge.  Cardio: RRR. No JVD.  Pulm: B/l clear to auscultation.  Effort normal Abd: Soft, BS+ MSK:   Gait mild antalgic.   +TTP lumbosacral PSPs and gluteal muscles R>L.             No edema.  Neuro:               Strength          5/5 in all LE myotomes (slightly limited due to pain inhibition) Skin: Warm and Dry. Intact    Assessment & Plan:  66 y/o female with pmh of OA, fibromyalgia, asthma, COPD, HTN, chronic pain presents for follow up for pain all over >> low back.    1. 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  She is not doing HEP.             L-spine films reviewed showing multilevel degenerative changes and anterolisthesis L4-5, MRI 07/2016 reviewed showing Moderate spinal stenosis at L4-5 >L3-L4 with foraminal narrowing ?nerve root compression L4-5.  Does not feel she needs TENS unit at present  Cont Cymbalta to 60mg   Cont Baclofen to 30 TID             Pt to explore pool therapy (encouraged 4th time)             UDS with Etoh  Pt is afraid of needles and is fearful on ESIs.  Pt would like to hold off on injections under sedation.                          2. Fibromyalgia  Cont Cymbalta to 60mg   3. Gait abnormality with several falls resulting in fracture             Cont cane for safety, pt states she does need all the time             D/ced Gabapentin as pt stated falls increased after increase in pills             Pt to have MRI brain - age related changes.   4. Sleep disturbance  Trazodone d/ced due to lack of efficacy  Cont Ambien 5qhs PRN (no benefit with 2.5)  5. Myalgia             Will consider trigger point injections in future - pt afraid of needles  6. Tobacco abuse  Pt attempting to quit, encouraged again

## 2016-08-18 ENCOUNTER — Other Ambulatory Visit: Payer: Self-pay

## 2016-08-18 MED ORDER — ZOLPIDEM TARTRATE 5 MG PO TABS: 5.0000 mg | ORAL_TABLET | Freq: Every evening | ORAL | 0 refills | Status: DC | PRN

## 2016-08-23 ENCOUNTER — Ambulatory Visit: Payer: Medicare Other | Admitting: Diagnostic Neuroimaging

## 2016-09-13 ENCOUNTER — Other Ambulatory Visit: Payer: Self-pay

## 2016-09-13 MED ORDER — ZOLPIDEM TARTRATE 5 MG PO TABS: 5.0000 mg | ORAL_TABLET | Freq: Every evening | ORAL | 0 refills | Status: DC | PRN

## 2016-10-12 ENCOUNTER — Other Ambulatory Visit: Payer: Self-pay | Admitting: Physical Medicine & Rehabilitation

## 2016-10-26 ENCOUNTER — Other Ambulatory Visit: Payer: Self-pay | Admitting: Physical Medicine & Rehabilitation

## 2016-11-13 DIAGNOSIS — G894 Chronic pain syndrome: Secondary | ICD-10-CM | POA: Diagnosis not present

## 2016-11-13 DIAGNOSIS — M79643 Pain in unspecified hand: Secondary | ICD-10-CM | POA: Diagnosis not present

## 2016-11-13 DIAGNOSIS — E785 Hyperlipidemia, unspecified: Secondary | ICD-10-CM | POA: Diagnosis not present

## 2016-11-15 ENCOUNTER — Other Ambulatory Visit: Payer: Self-pay | Admitting: *Deleted

## 2016-11-15 MED ORDER — ZOLPIDEM TARTRATE 5 MG PO TABS: 5.0000 mg | ORAL_TABLET | Freq: Every evening | ORAL | 0 refills | Status: DC | PRN

## 2016-11-15 NOTE — Telephone Encounter (Signed)
Received refill request via fax from [facility] for ambien 5 mg.  Dr. Serita Grit last clinic note stated: 'Cont Ambien 5qhs PRN (no benefit with 2.5)'. I called [facility] and placed an order for refill. Patient's next appointment is 12/07/2016 with Dr. Posey Pronto.

## 2016-11-17 ENCOUNTER — Encounter: Payer: Medicare Other | Admitting: Physical Medicine & Rehabilitation

## 2016-12-07 ENCOUNTER — Encounter: Payer: Medicare Other | Admitting: Physical Medicine & Rehabilitation

## 2016-12-11 ENCOUNTER — Other Ambulatory Visit: Payer: Self-pay | Admitting: Physical Medicine & Rehabilitation

## 2017-01-10 ENCOUNTER — Other Ambulatory Visit: Payer: Self-pay | Admitting: Physical Medicine & Rehabilitation

## 2017-01-14 ENCOUNTER — Other Ambulatory Visit: Payer: Self-pay | Admitting: Physical Medicine & Rehabilitation

## 2017-01-15 ENCOUNTER — Other Ambulatory Visit: Payer: Self-pay | Admitting: *Deleted

## 2017-01-15 MED ORDER — BACLOFEN 20 MG PO TABS: ORAL_TABLET | ORAL | 0 refills | Status: DC

## 2017-02-07 ENCOUNTER — Other Ambulatory Visit: Payer: Self-pay | Admitting: Family Medicine

## 2017-02-07 ENCOUNTER — Encounter: Payer: Medicare Other | Admitting: Physical Medicine & Rehabilitation

## 2017-02-07 DIAGNOSIS — Z1231 Encounter for screening mammogram for malignant neoplasm of breast: Secondary | ICD-10-CM

## 2017-02-13 ENCOUNTER — Other Ambulatory Visit: Payer: Self-pay | Admitting: Internal Medicine

## 2017-02-13 ENCOUNTER — Ambulatory Visit
Admission: RE | Admit: 2017-02-13 | Discharge: 2017-02-13 | Disposition: A | Discharge status: Home / Self Care | Payer: No Typology Code available for payment source | Source: Ambulatory Visit | Attending: Internal Medicine | Admitting: Internal Medicine

## 2017-02-13 DIAGNOSIS — Z9289 Personal history of other medical treatment: Secondary | ICD-10-CM

## 2017-02-15 ENCOUNTER — Ambulatory Visit: Payer: Medicare Other

## 2017-02-19 ENCOUNTER — Other Ambulatory Visit: Payer: Self-pay | Admitting: Internal Medicine

## 2017-02-19 DIAGNOSIS — R0602 Shortness of breath: Secondary | ICD-10-CM

## 2017-02-20 ENCOUNTER — Ambulatory Visit (INDEPENDENT_AMBULATORY_CARE_PROVIDER_SITE_OTHER): Payer: Medicare (Managed Care) | Admitting: Internal Medicine

## 2017-02-20 DIAGNOSIS — R0602 Shortness of breath: Secondary | ICD-10-CM

## 2017-02-20 LAB — PULMONARY FUNCTION TEST
DL/VA % pred: 79 %
DL/VA: 3.42 ml/min/mmHg/L
DLCO COR: 11.81 ml/min/mmHg
DLCO UNC % PRED: 62 %
DLCO cor % pred: 60 %
DLCO unc: 12.26 ml/min/mmHg
FEF 25-75 PRE: 1.01 L/s
FEF 25-75 Post: 1.32 L/sec
FEF2575-%Change-Post: 30 %
FEF2575-%PRED-PRE: 64 %
FEF2575-%Pred-Post: 84 %
FEV1-%Change-Post: 5 %
FEV1-%PRED-PRE: 93 %
FEV1-%Pred-Post: 98 %
FEV1-POST: 1.58 L
FEV1-Pre: 1.5 L
FEV1FVC-%Change-Post: 0 %
FEV1FVC-%Pred-Pre: 93 %
FEV6-%CHANGE-POST: 6 %
FEV6-%PRED-PRE: 100 %
FEV6-%Pred-Post: 107 %
FEV6-POST: 2.14 L
FEV6-Pre: 2 L
FEV6FVC-%CHANGE-POST: 1 %
FEV6FVC-%PRED-POST: 103 %
FEV6FVC-%Pred-Pre: 101 %
FVC-%Change-Post: 5 %
FVC-%PRED-POST: 103 %
FVC-%Pred-Pre: 98 %
FVC-Post: 2.16 L
FVC-Pre: 2.05 L
POST FEV6/FVC RATIO: 99 %
Post FEV1/FVC ratio: 73 %
Pre FEV1/FVC ratio: 73 %
Pre FEV6/FVC Ratio: 98 %
RV % pred: 101 %
RV: 1.98 L
TLC % PRED: 87 %
TLC: 3.95 L

## 2017-02-20 NOTE — Progress Notes (Signed)
PFT done today. 

## 2017-03-08 ENCOUNTER — Ambulatory Visit: Payer: Medicare Other

## 2017-04-19 ENCOUNTER — Ambulatory Visit
Admission: RE | Admit: 2017-04-19 | Discharge: 2017-04-19 | Disposition: A | Discharge status: Home / Self Care | Payer: Medicare (Managed Care) | Source: Ambulatory Visit | Attending: Family Medicine | Admitting: Family Medicine

## 2017-04-19 DIAGNOSIS — Z1231 Encounter for screening mammogram for malignant neoplasm of breast: Secondary | ICD-10-CM

## 2017-06-12 DIAGNOSIS — H903 Sensorineural hearing loss, bilateral: Secondary | ICD-10-CM | POA: Insufficient documentation

## 2017-06-12 DIAGNOSIS — H6993 Unspecified Eustachian tube disorder, bilateral: Secondary | ICD-10-CM | POA: Insufficient documentation

## 2017-06-12 DIAGNOSIS — S0340XA Sprain of jaw, unspecified side, initial encounter: Secondary | ICD-10-CM | POA: Insufficient documentation

## 2017-06-12 DIAGNOSIS — H6983 Other specified disorders of Eustachian tube, bilateral: Secondary | ICD-10-CM | POA: Insufficient documentation

## 2017-06-12 DIAGNOSIS — J324 Chronic pansinusitis: Secondary | ICD-10-CM | POA: Insufficient documentation

## 2017-08-07 DIAGNOSIS — R42 Dizziness and giddiness: Secondary | ICD-10-CM | POA: Insufficient documentation

## 2017-08-07 DIAGNOSIS — K219 Gastro-esophageal reflux disease without esophagitis: Secondary | ICD-10-CM | POA: Insufficient documentation

## 2017-08-10 ENCOUNTER — Other Ambulatory Visit: Payer: Self-pay | Admitting: Internal Medicine

## 2017-08-10 DIAGNOSIS — K219 Gastro-esophageal reflux disease without esophagitis: Secondary | ICD-10-CM

## 2017-08-10 DIAGNOSIS — R1310 Dysphagia, unspecified: Secondary | ICD-10-CM

## 2017-08-16 ENCOUNTER — Other Ambulatory Visit: Payer: Medicare (Managed Care)

## 2017-09-03 ENCOUNTER — Ambulatory Visit
Admission: RE | Admit: 2017-09-03 | Discharge: 2017-09-03 | Disposition: A | Discharge status: Home / Self Care | Payer: Medicare (Managed Care) | Source: Ambulatory Visit | Attending: Internal Medicine | Admitting: Internal Medicine

## 2017-09-03 DIAGNOSIS — R1310 Dysphagia, unspecified: Secondary | ICD-10-CM

## 2017-09-03 DIAGNOSIS — K219 Gastro-esophageal reflux disease without esophagitis: Secondary | ICD-10-CM

## 2017-12-11 ENCOUNTER — Ambulatory Visit
Admission: RE | Admit: 2017-12-11 | Discharge: 2017-12-11 | Disposition: A | Discharge status: Home / Self Care | Payer: Medicare (Managed Care) | Source: Ambulatory Visit | Attending: Nurse Practitioner | Admitting: Nurse Practitioner

## 2017-12-11 ENCOUNTER — Other Ambulatory Visit: Payer: Self-pay | Admitting: Nurse Practitioner

## 2017-12-11 DIAGNOSIS — R109 Unspecified abdominal pain: Secondary | ICD-10-CM

## 2017-12-11 DIAGNOSIS — W19XXXA Unspecified fall, initial encounter: Secondary | ICD-10-CM

## 2018-03-03 IMAGING — MG 2D DIGITAL SCREENING BILATERAL MAMMOGRAM WITH CAD AND ADJUNCT TO
8 of 12 series · 8 of 28 positions shown · non-contrast
Comparison: Previous exam(s).

CLINICAL DATA: Screening.

EXAM:
2D DIGITAL SCREENING BILATERAL MAMMOGRAM WITH CAD AND ADJUNCT TOMO

[R CC synth-2D]
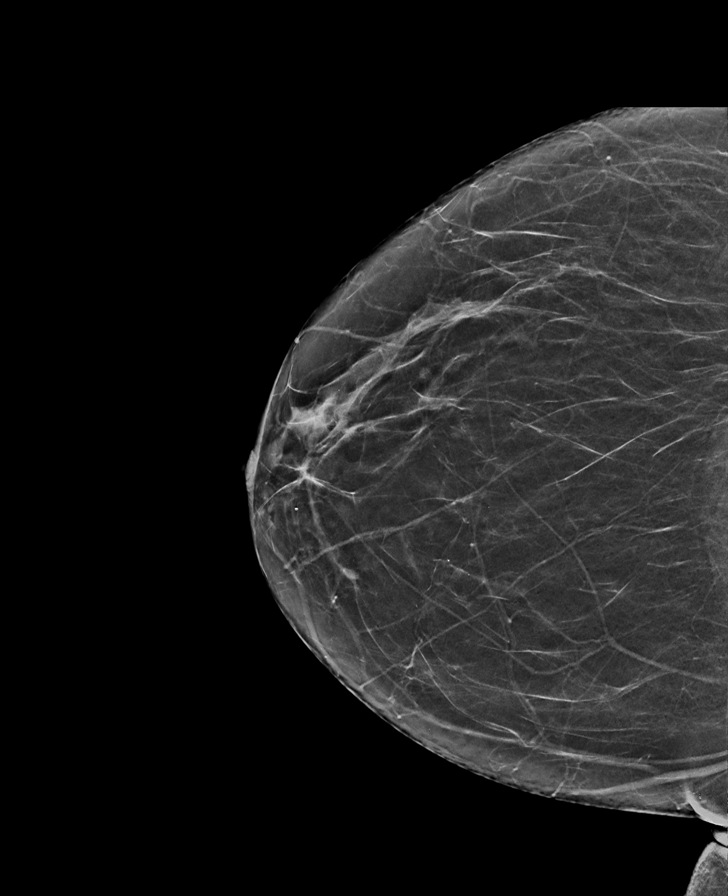

[L CC synth-2D]
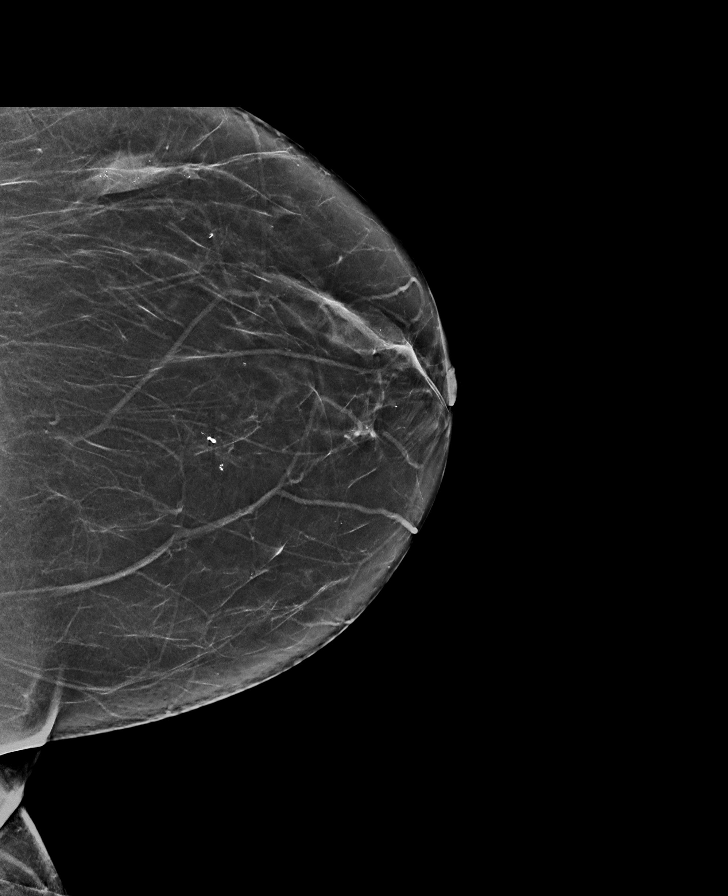

[R CC]
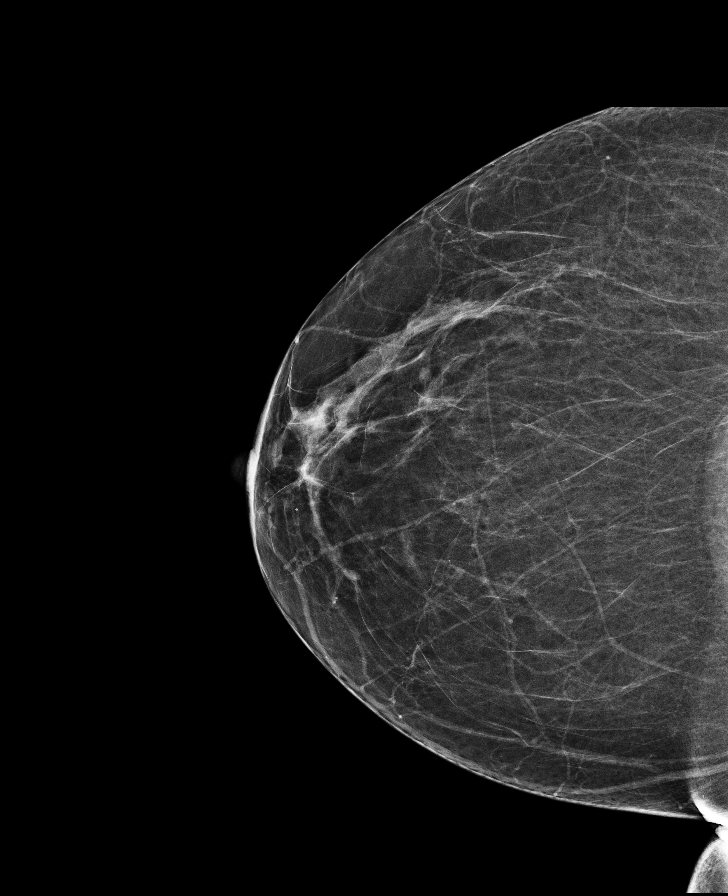

[R MLO]
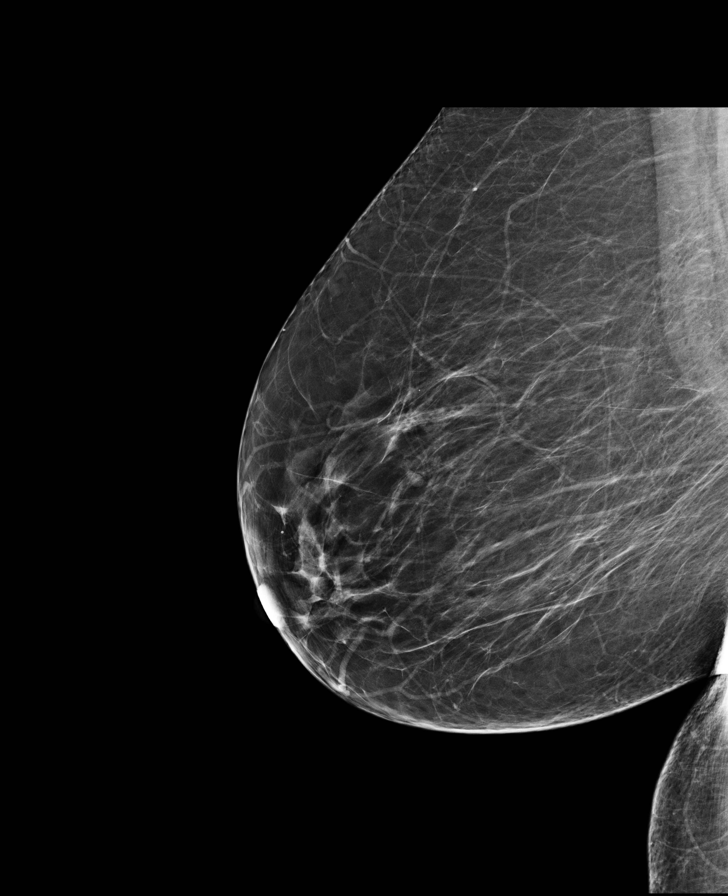

[L MLO synth-2D]
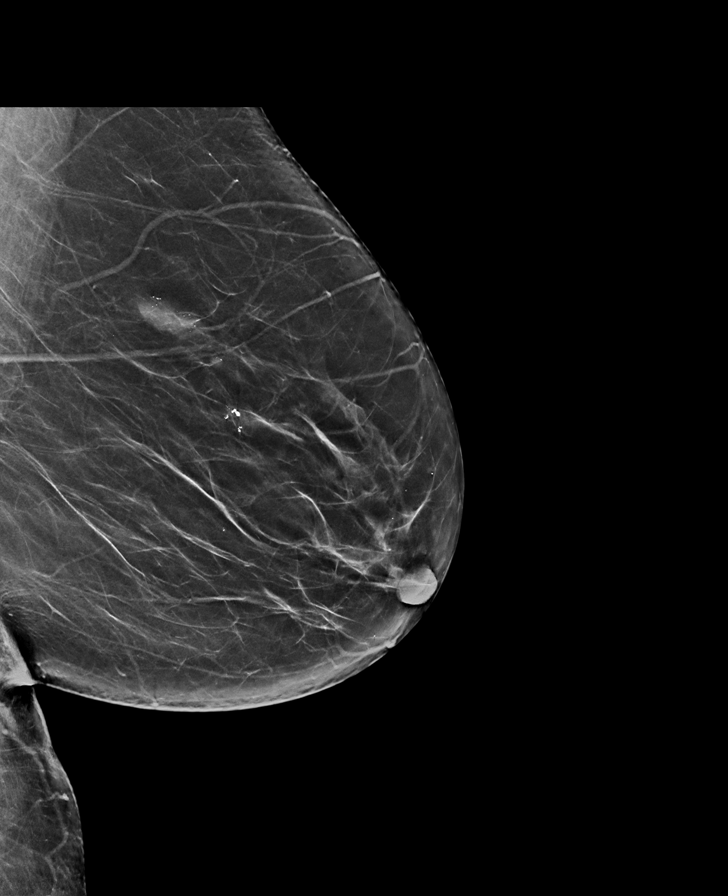

[L MLO]
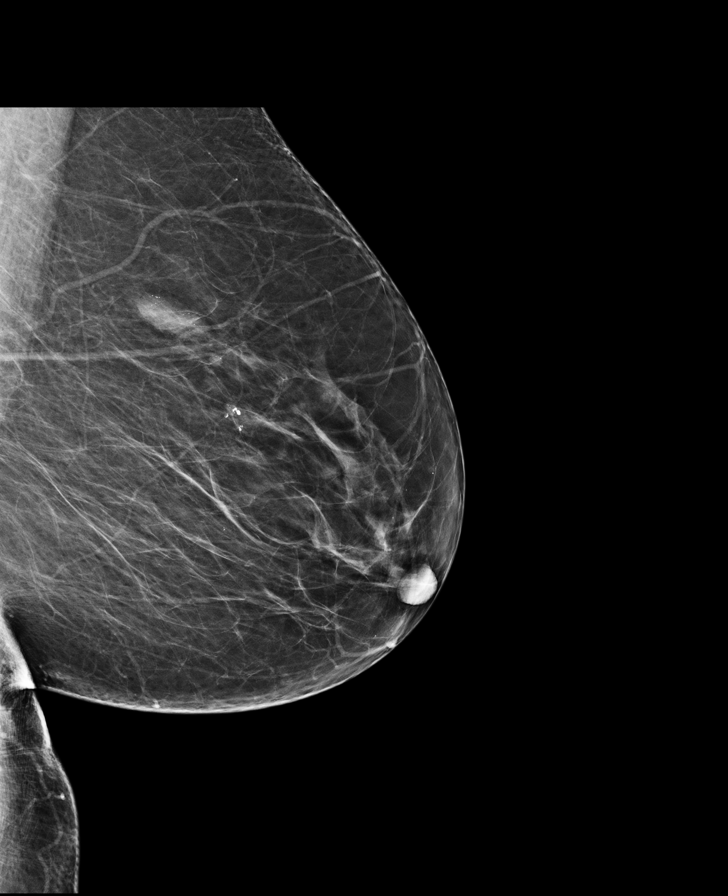

[L CC]
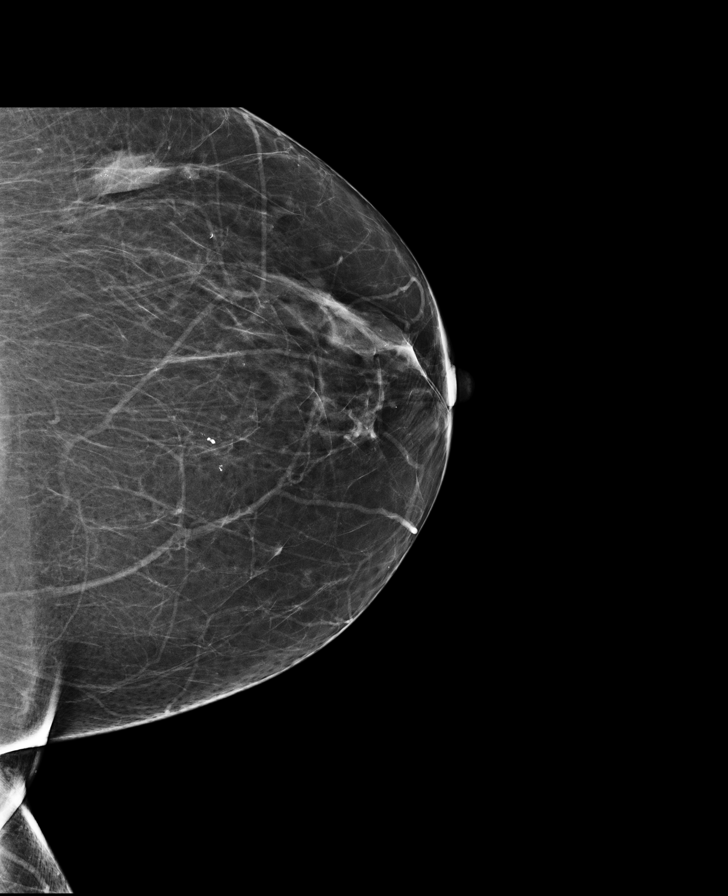

[R MLO synth-2D]
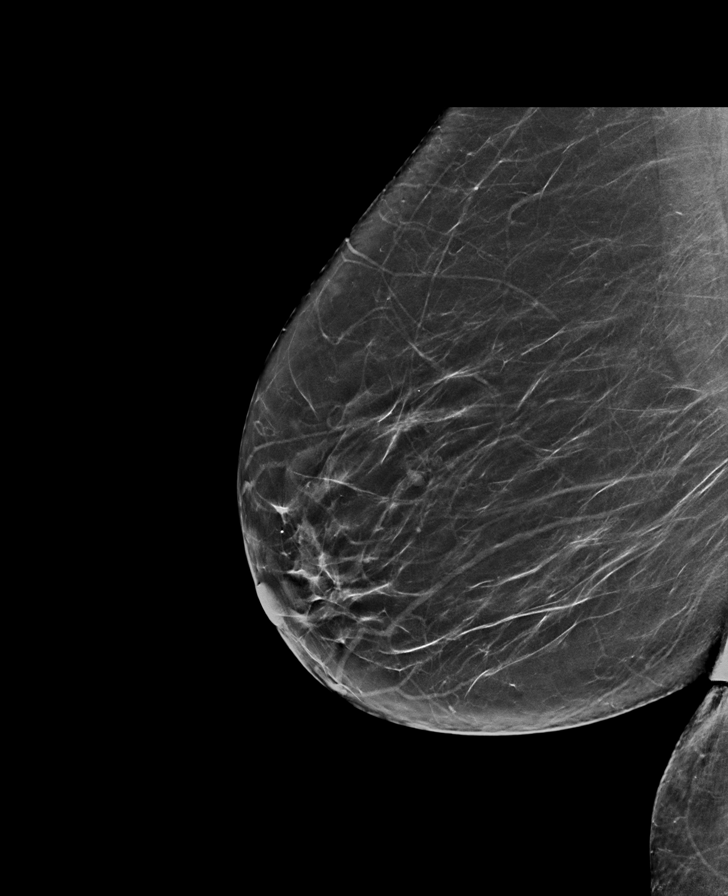

[8 of 28 positions shown; findings below may reference images not displayed]

ACR Breast Density Category b: There are scattered areas of
fibroglandular density.
FINDINGS: There are no findings suspicious for malignancy. Images were
processed with CAD.
IMPRESSION: No mammographic evidence of malignancy. A result letter of this
screening mammogram will be mailed directly to the patient.

RECOMMENDATION:
Screening mammogram in one year. (Code:97-6-RS4)

BI-RADS CATEGORY  1: Negative.

## 2018-03-28 ENCOUNTER — Ambulatory Visit
Admission: RE | Admit: 2018-03-28 | Discharge: 2018-03-28 | Disposition: A | Discharge status: Home / Self Care | Payer: Medicare (Managed Care) | Source: Ambulatory Visit | Attending: Internal Medicine | Admitting: Internal Medicine

## 2018-03-28 ENCOUNTER — Other Ambulatory Visit: Payer: Self-pay | Admitting: Internal Medicine

## 2018-03-28 DIAGNOSIS — R0989 Other specified symptoms and signs involving the circulatory and respiratory systems: Secondary | ICD-10-CM

## 2018-03-28 DIAGNOSIS — R059 Cough, unspecified: Secondary | ICD-10-CM

## 2018-03-28 DIAGNOSIS — R05 Cough: Secondary | ICD-10-CM

## 2018-04-25 ENCOUNTER — Other Ambulatory Visit: Payer: Self-pay | Admitting: Family Medicine

## 2018-04-25 DIAGNOSIS — Z1231 Encounter for screening mammogram for malignant neoplasm of breast: Secondary | ICD-10-CM

## 2018-05-21 ENCOUNTER — Ambulatory Visit
Admission: RE | Admit: 2018-05-21 | Discharge: 2018-05-21 | Disposition: A | Discharge status: Home / Self Care | Payer: Medicare (Managed Care) | Source: Ambulatory Visit | Attending: Family Medicine | Admitting: Family Medicine

## 2018-05-21 DIAGNOSIS — Z1231 Encounter for screening mammogram for malignant neoplasm of breast: Secondary | ICD-10-CM

## 2018-10-06 ENCOUNTER — Encounter (HOSPITAL_BASED_OUTPATIENT_CLINIC_OR_DEPARTMENT_OTHER): Payer: Self-pay | Admitting: *Deleted

## 2018-10-06 ENCOUNTER — Emergency Department (HOSPITAL_BASED_OUTPATIENT_CLINIC_OR_DEPARTMENT_OTHER): Payer: Medicare (Managed Care)

## 2018-10-06 ENCOUNTER — Emergency Department (HOSPITAL_BASED_OUTPATIENT_CLINIC_OR_DEPARTMENT_OTHER)
Admission: EM | Admit: 2018-10-06 | Discharge: 2018-10-06 | Disposition: A | Discharge status: Home / Self Care | Payer: Medicare (Managed Care) | Attending: Emergency Medicine | Admitting: Emergency Medicine

## 2018-10-06 ENCOUNTER — Other Ambulatory Visit: Payer: Self-pay

## 2018-10-06 DIAGNOSIS — I10 Essential (primary) hypertension: Secondary | ICD-10-CM | POA: Insufficient documentation

## 2018-10-06 DIAGNOSIS — Y999 Unspecified external cause status: Secondary | ICD-10-CM | POA: Insufficient documentation

## 2018-10-06 DIAGNOSIS — Y929 Unspecified place or not applicable: Secondary | ICD-10-CM | POA: Insufficient documentation

## 2018-10-06 DIAGNOSIS — S60221A Contusion of right hand, initial encounter: Secondary | ICD-10-CM | POA: Diagnosis not present

## 2018-10-06 DIAGNOSIS — S6991XA Unspecified injury of right wrist, hand and finger(s), initial encounter: Secondary | ICD-10-CM | POA: Diagnosis present

## 2018-10-06 DIAGNOSIS — W010XXA Fall on same level from slipping, tripping and stumbling without subsequent striking against object, initial encounter: Secondary | ICD-10-CM | POA: Diagnosis not present

## 2018-10-06 DIAGNOSIS — F17200 Nicotine dependence, unspecified, uncomplicated: Secondary | ICD-10-CM | POA: Diagnosis not present

## 2018-10-06 DIAGNOSIS — J449 Chronic obstructive pulmonary disease, unspecified: Secondary | ICD-10-CM | POA: Insufficient documentation

## 2018-10-06 DIAGNOSIS — Z79899 Other long term (current) drug therapy: Secondary | ICD-10-CM | POA: Insufficient documentation

## 2018-10-06 DIAGNOSIS — Y939 Activity, unspecified: Secondary | ICD-10-CM | POA: Insufficient documentation

## 2018-10-06 MED ORDER — ACETAMINOPHEN 325 MG PO TABS
650.0000 mg | ORAL_TABLET | Freq: Once | ORAL | Status: AC
  Administered 2018-10-06: 650 mg via ORAL
  Filled 2018-10-06: qty 2

## 2018-10-06 NOTE — ED Notes (Signed)
X-ray at bedside

## 2018-10-06 NOTE — ED Provider Notes (Signed)
[facility] EMERGENCY DEPARTMENT Provider Note   CSN: [id number] Arrival date & time: 10/06/18  0003     History   Chief Complaint Chief Complaint  Patient presents with  . Fall    etoh    HPI Danielle Clay is a 68 y.o. female.     HPI  This is a 68 year old female with a history of COPD and hypertension who presents following a fall.  Patient reports that she had "a lot to drink tonight" and fell onto her right hand.  Denies syncope.  Reports mechanical fall.  She is reporting pain over the proximal aspects of her right second through fourth digits.  She has not taken anything for pain.  Rates her pain at 7 out of 10.  She is right-handed.  Denies any wrist or arm pain.  Denies hitting her head or loss of consciousness.  She is not on any anticoagulants.  Past Medical History:  Diagnosis Date  . Allergy   . Arthritis   . Bronchial asthma   . COPD (chronic obstructive pulmonary disease) ([place])   . Fibromyalgia   . Hypertension   . Migraine     There are no active problems to display for this patient.   Past Surgical History:  Procedure Laterality Date  . BREAST EXCISIONAL BIOPSY Right   . CYST REMOVAL HAND     Under left breast  . GALLBLADDER SURGERY    . TONSILLECTOMY AND ADENOIDECTOMY       OB History   No obstetric history on file.      Home Medications    Prior to Admission medications   Medication Sig Start Date End Date Taking? Authorizing Provider  alendronate (FOSAMAX) 70 MG tablet Take 70 mg by mouth once a week. Take with a full glass of water on an empty stomach.    [provider]  ALPRAZolam Duanne Moron) 0.5 MG tablet Take 1 tablet (0.5 mg total) by mouth as needed for anxiety (for sedation before MRI scan; take 1 hour before scan; may repeat 15 min before scan). 06/13/16   Penumalli, Earlean Polka, MD  amLODipine (NORVASC) 10 MG tablet Take 10 mg by mouth daily.    [provider]  baclofen (LIORESAL) 20 MG tablet Take 1 and  1/2 tablets by mouth 3 times daily 01/15/17   Jamse Arn, MD  DULoxetine (CYMBALTA) 60 MG capsule Take 1 capsule (60 mg total) by mouth daily. 01/10/17   Jamse Arn, MD  fluticasone (VERAMYST) 27.5 MCG/SPRAY nasal spray Place 2 sprays into the nose daily.    [provider]  Fluticasone-Salmeterol (ADVAIR) 250-50 MCG/DOSE AEPB Inhale 1 puff into the lungs 2 (two) times daily.    [provider]  gabapentin (NEURONTIN) 300 MG capsule Take 300 mg by mouth 2 (two) times daily.    [provider]  linaclotide (LINZESS) 72 MCG capsule Take 72 mcg by mouth daily.    [provider]  montelukast (SINGULAIR) 10 MG tablet Take 10 mg by mouth at bedtime.    [provider]  omega-3 acid ethyl esters (LOVAZA) 1 g capsule Take by mouth 2 (two) times daily.    [provider]  omeprazole (PRILOSEC) 10 MG capsule Take 40 mg by mouth daily.    [provider]  tiotropium (SPIRIVA HANDIHALER) 18 MCG inhalation capsule Place 18 mcg into inhaler and inhale daily.    [provider]  triamcinolone cream (KENALOG) 0.1 % Apply 1 application topically 2 (  two) times daily.    [provider]  zolpidem (AMBIEN) 5 MG tablet Take 1 tablet (5 mg total) by mouth at bedtime as needed for sleep. 11/15/16   Jamse Arn, MD    Family History Family History  Problem Relation Age of Onset  . Diabetes Mother   . Anemia Mother   . HIV Sister   . Heart Problems Brother   . Asthma Brother   . HIV Brother   . HIV Brother     Social History Social History   Tobacco Use  . Smoking status: Current Every Day Smoker  . Smokeless tobacco: Never Used  Substance Use Topics  . Alcohol use: No    Comment: "once or twice a year wine"  . Drug use: Not Currently     Allergies   Penicillins and Percocet [oxycodone-acetaminophen]   Review of Systems Review of Systems  Constitutional: Negative for fever.  Musculoskeletal:        Hand pain  Neurological: Negative for dizziness, syncope, weakness and numbness.  All other systems reviewed and are negative.    Physical Exam Updated Vital Signs BP 110/67 (BP Location: Left Arm)   Pulse 91   Temp 98.5 F (36.9 C) (Oral)   Resp 16   Ht 1.575 m (5\' 2" )   Wt 65.8 kg   SpO2 97%   BMI 26.52 kg/m   Physical Exam Vitals signs and nursing note reviewed.  Constitutional:      Appearance: She is well-developed. She is not ill-appearing.     Comments: ABCs intact  HENT:     Head: Normocephalic and atraumatic.  Eyes:     Pupils: Pupils are equal, round, and reactive to light.  Cardiovascular:     Rate and Rhythm: Normal rate and regular rhythm.     Heart sounds: Normal heart sounds.  Pulmonary:     Effort: Pulmonary effort is normal. No respiratory distress.     Breath sounds: No wheezing.  Abdominal:     Palpations: Abdomen is soft.     Tenderness: There is no abdominal tenderness.  Musculoskeletal:     Comments: Examination of the right hand with a small abrasion over the thenar prominence of the palm, no snuffbox tenderness, normal range of motion with flexion and extension of the fingers, no significant deformity noted.  Tenderness to palpation over the abrasion, 2+ radial pulse  Skin:    General: Skin is warm and dry.  Neurological:     Mental Status: She is alert and oriented to person, place, and time.  Psychiatric:     Comments: Appears intoxicated      ED Treatments / Results  Labs (all labs ordered are listed, but only abnormal results are displayed) Labs Reviewed - No data to display  EKG None  Radiology Dg Hand Complete Right  Result Date: 10/06/2018 CLINICAL DATA:  Right hand pain after fall tonight. Decreased range of motion. EXAM: RIGHT HAND - COMPLETE 3+ VIEW COMPARISON:  None. FINDINGS: There is no evidence of fracture or dislocation. Multifocal relatively advanced osteoarthritis. Joint space narrowing with proliferative change  involving the thumb carpometacarpal and metacarpal phalangeal joint, throughout the digits, second and fourth metacarpal phalangeal joints and to a lesser extent wrist. Soft tissues are unremarkable. IMPRESSION: 1. No acute fracture or subluxation of the right hand. 2. Multifocal osteoarthritis. Electronically Signed   By: Keith Rake M.D.   On: 10/06/2018 01:25    Procedures Procedures (including critical care time)  Medications  Ordered in ED Medications  acetaminophen (TYLENOL) tablet 650 mg (650 mg Oral Given 10/06/18 0056)     Initial Impression / Assessment and Plan / ED Course  I have reviewed the triage vital signs and the nursing notes.  Pertinent labs & imaging results that were available during my care of the patient were reviewed by me and considered in my medical decision making (see chart for details).        Patient presents with right hand pain after a fall.  She is nontoxic-appearing and ABCs are intact.  She does appear intoxicated and endorses alcohol use tonight.  Exam is reassuring.  X-ray showed no evidence of fracture.  Suspect contusion.  Recommend ice and anti-inflammatories.  After history, exam, and medical workup I feel the patient has been appropriately medically screened and is safe for discharge home. Pertinent diagnoses were discussed with the patient. Patient was given return precautions.   Final Clinical Impressions(s) / ED Diagnoses   Final diagnoses:  Contusion of right hand, initial encounter    ED Discharge Orders    None       [place], Barbette Hair, MD 10/06/18 0210

## 2018-10-06 NOTE — Discharge Instructions (Addendum)
Your x-rays are negative.  Keep your hand iced and elevated.

## 2018-10-06 NOTE — ED Triage Notes (Signed)
Pt reports fall tonight. [place] forward and tried to catch herself. C/o pain in right wrist. Slurred speech. Admits to etoh

## 2018-10-06 NOTE — ED Notes (Signed)
Pt and family member understood dc material. NAD noted. All questions answered to satisfaction. Pt and family escorted to check out window.

## 2019-01-02 ENCOUNTER — Encounter: Payer: Self-pay | Admitting: Gastroenterology

## 2019-02-03 ENCOUNTER — Other Ambulatory Visit: Payer: Self-pay

## 2019-02-03 ENCOUNTER — Encounter: Payer: Self-pay | Admitting: Gastroenterology

## 2019-02-03 ENCOUNTER — Ambulatory Visit (INDEPENDENT_AMBULATORY_CARE_PROVIDER_SITE_OTHER): Payer: Medicare (Managed Care) | Admitting: Gastroenterology

## 2019-02-03 VITALS — BP 110/68 | HR 94 | Temp 99.2°F | Ht 61.75 in | Wt 156.0 lb

## 2019-02-03 DIAGNOSIS — Z86010 Personal history of colonic polyps: Secondary | ICD-10-CM | POA: Diagnosis not present

## 2019-02-03 DIAGNOSIS — K5909 Other constipation: Secondary | ICD-10-CM | POA: Diagnosis not present

## 2019-02-03 NOTE — Patient Instructions (Addendum)
If you are age 69 or older, your body mass index should be between 23-30. Your Body mass index is 28.76 kg/m. If this is out of the aforementioned range listed, please consider follow up with your Primary Care Provider.  If you are age 55 or younger, your body mass index should be between 19-25. Your Body mass index is 28.76 kg/m. If this is out of the aformentioned range listed, please consider follow up with your Primary Care Provider.   We will call [facility] to schedule your colonoscopy.   Miralax 1 capful a day.   It was a pleasure to see you today!  Dr. Loletha Carrow

## 2019-02-03 NOTE — Progress Notes (Addendum)
[facility] Consult Note:  History: Danielle Clay 02/03/2019  Referring provider: Angelica Pou, MD  Reason for consult/chief complaint: history of colon polyps (pt states at least five years since last colonosocopy; ) and Constipation (pt reports infrequent, hard stools; laxatives help a little; sometimes accompanied by abdominal pain)   Subjective  HPI:  This is a very pleasant 68 year old woman referred by primary care at [facility] regarding constipation and history of colon polyps.  She initially felt the constipation was going on perhaps a year, but then later thought it was probably longer, perhaps as far back as her last colonoscopy 5 years ago in [place] where she recalls the bowel prep being suboptimal and 2 polyps removed.  At any rate, the constipation seems worse over the last year or so.  She was given Linzess, but stopped because it caused diarrhea.  She would sometimes take senna, but that also cause diarrhea such that she then had to use Imodium.  Lately she is on bisacodyl at bedtime, and with that is having a bowel movement once a week at most.  However, that seems an improvement from before.  She does not recall any other particular treatment she has taken for constipation.  She denies rectal bleeding.  There is lower abdominal crampy pain and some low back pain before bowel movements.  She also describes a throat discomfort, frequent coughing of sputum and sinus congestion, particularly when she lays back at night she can feel things dripping into her throat.  That all seem to occur after moving here from [place].  Those symptoms persist despite Zyrtec and Flonase.  ROS:  Review of Systems  Constitutional: Negative for appetite change and unexpected weight change.  HENT: Negative for mouth sores and voice change.   Eyes: Negative for pain and redness.  Respiratory: Positive for cough and wheezing. Negative for shortness of breath.    Cardiovascular: Negative for chest pain and palpitations.  Genitourinary: Negative for dysuria and hematuria.  Musculoskeletal: Positive for arthralgias, back pain and myalgias.       Fibromyalgia  Skin: Negative for pallor and rash.  Allergic/Immunologic: Positive for environmental allergies.  Neurological: Negative for weakness and headaches.  Hematological: Negative for adenopathy.     Past Medical History: Past Medical History:  Diagnosis Date  . Allergy   . Arthritis   . Bronchial asthma   . Colon polyps   . COPD (chronic obstructive pulmonary disease) ([place])   . Fibromyalgia   . GERD (gastroesophageal reflux disease)   . Hypertension   . Migraine      Past Surgical History: Past Surgical History:  Procedure Laterality Date  . BREAST EXCISIONAL BIOPSY Right   . CYST REMOVAL HAND     Under left breast  . GALLBLADDER SURGERY    . TONSILLECTOMY AND ADENOIDECTOMY       Family History: Family History  Problem Relation Age of Onset  . Diabetes Mother   . Anemia Mother   . HIV Sister   . Heart Problems Brother   . Asthma Brother   . HIV Brother   . HIV Brother     Social History: Social History   Socioeconomic History  . Marital status: Single    Spouse name: Not on file  . Number of children: 3  . Years of education: GED  . Highest education level: Not on file  Occupational History  . Occupation: N/A  Social Needs  . Financial resource strain: Not on  file  . Food insecurity    Worry: Not on file    Inability: Not on file  . Transportation needs    Medical: Not on file    Non-medical: Not on file  Tobacco Use  . Smoking status: Current Every Day Smoker  . Smokeless tobacco: Never Used  Substance and Sexual Activity  . Alcohol use: No    Comment: few times a week  . Drug use: Not Currently  . Sexual activity: Never  Lifestyle  . Physical activity    Days per week: Not on file    Minutes per session: Not on file  . Stress: Not on file   Relationships  . Social Herbalist on phone: Not on file    Gets together: Not on file    Attends religious service: Not on file    Active member of club or organization: Not on file    Attends meetings of clubs or organizations: Not on file    Relationship status: Not on file  Other Topics Concern  . Not on file  Social History Narrative   Lives at home alone   Right-handed   Caffeine: tea    Allergies: Allergies  Allergen Reactions  . Penicillins Swelling  . Percocet [Oxycodone-Acetaminophen] Itching    Outpatient Meds: Current Outpatient Medications  Medication Sig Dispense Refill  . alendronate (FOSAMAX) 70 MG tablet Take 70 mg by mouth once a week. Take with a full glass of water on an empty stomach.    . ALPRAZolam (XANAX) 0.5 MG tablet Take 1 tablet (0.5 mg total) by mouth as needed for anxiety (for sedation before MRI scan; take 1 hour before scan; may repeat 15 min before scan). 3 tablet 0  . amLODipine (NORVASC) 10 MG tablet Take 10 mg by mouth daily.    . Bisacodyl (LAXATIVE PO) Take 1 tablet by mouth at bedtime as needed.    . DULoxetine (CYMBALTA) 60 MG capsule Take 1 capsule (60 mg total) by mouth daily. 30 capsule 1  . fluticasone (VERAMYST) 27.5 MCG/SPRAY nasal spray Place 2 sprays into the nose daily.    Marland Kitchen lisinopril (ZESTRIL) 2.5 MG tablet Take 2.5 mg by mouth daily.    . Mometasone Furoate 50 MCG/ACT AERO Inhale 2 sprays into the lungs daily.    . pantoprazole (PROTONIX) 20 MG tablet Take 20 mg by mouth daily.    . pregabalin (LYRICA) 50 MG capsule Take 50 mg by mouth 2 (two) times daily.    . Suvorexant (BELSOMRA) 20 MG TABS Take 1 tablet by mouth at bedtime.    . traZODone (DESYREL) 50 MG tablet Take 50 mg by mouth at bedtime.    . triamcinolone cream (KENALOG) 0.1 % Apply 1 application topically 2 (two) times daily.     No current facility-administered medications for this visit.        ___________________________________________________________________ Objective   Exam:  BP 110/68   Pulse 94   Temp 99.2 F (37.3 C)   Ht 5' 1.75" (1.568 m)   Wt 156 lb (70.8 kg)   BMI 28.76 kg/m    General: Pleasant, not acutely ill-appearing.  Breathing comfortably on room air.  Eyes: sclera anicteric, no redness  ENT: oral mucosa moist without lesions, no cervical or supraclavicular lymphadenopathy  CV: RRR without murmur, S1/S2, no JVD, no peripheral edema  Resp: End expiratory wheezing bilaterally, normal RR and effort noted  GI: soft, lower tenderness, with active bowel sounds. No guarding or  palpable organomegaly noted, though limited by body habitus  Skin; warm and dry, no rash or jaundice noted  Neuro: awake, alert and oriented x 3. Normal gross motor function and fluent speech  Labs: No outpatient data for review No prior colonoscopy or pathology report for review  Assessment: Encounter Diagnoses  Name Primary?  . Chronic constipation Yes  . Personal history of colonic polyps     Constipation, exact duration unclear but sounds worse within the last year or so.  She believes the constipation fell up well after starting the Cymbalta.  She wondered about the Lyrica as a possible cause.  Unknown number, size and type of previous colon polyps.  She seems to have ongoing allergic symptoms with postnasal drip and throat discomfort and coughing.  Suggest primary care reevaluation and consideration of either allergy or ENT evaluation as well as smoking cessation.  Plan:  Colonoscopy.  She is agreeable after discussion of procedure and risks.  The benefits and risks of the planned procedure were described in detail with the patient or (when appropriate) their health care proxy.  Risks were outlined as including, but not limited to, bleeding, infection, perforation, adverse medication reaction leading to cardiac or pulmonary decompensation, pancreatitis (if ERCP).   The limitation of incomplete mucosal visualization was also discussed.  No guarantees or warranties were given.  2-day prep  Once daily MiraLAX to be started now in addition to the Dulcolax she currently takes at bedtime.   Thank you for the courtesy of this consult.  Please call me with any questions or concerns.  Nelida Meuse III  CC: Referring provider noted above  Addendum 02/10/19:  Add'l clinical information relayed to [facility] nursing by patient's PCP at [facility]:  "Dr. Loletha Carrow,   I received a phone call from Dr. Jimmye Norman from [facility] who reviewed your office note but wanted to communicate corrected information mistakenly provided by the patient in your office visit with her on 02/03/2019.   1. The patient is taking Senna S 2 tabs PO QHS PRN (only q3 days if she has not had a BM). The patient relayed to you that she takes Dulcolax, this is incorrect. Per Dr. Jimmye Norman, she does not take the Senna as directed. Dr. Jimmye Norman directed the patient to take 1 tab at a time (as 2 often induces diarrhea). They are attempting to find a therapeutic dose that will not induce diarrhea.  2. The patient has already been seen by ENT. She is on "maximum therapy" but continues to have chronic symptoms.   3. The patient was diagnosed with IB (alternating constipation/diarrhea) while she was living in [place]. Dr. Jimmye Norman estimates the patient was diagnosed at least 2 years ago but not more than 5 years ago.   4. The patient has fibromyalgia. "Symptoms are functional." She just started on Lyrica (10/16) and is unsure if this has affected her bowel habits. The patient comes to see Dr. Jimmye Norman every 3-6 months and has not reported any change in her bowel habits in the last 2 years.   5. On Cymbalta for 2 years.   6. She was also taken off anticholinergic medications and calcium to try and improve bowel function.   7. Dr. Jimmye Norman is hoping for a "clean" colonoscopy without the discovery of new polyps  and has provided her office number if you would like to discuss [phone])."  - H. Loletha Carrow, MD  02/10/19

## 2019-02-05 ENCOUNTER — Encounter: Payer: Self-pay | Admitting: Gastroenterology

## 2019-02-07 ENCOUNTER — Telehealth: Payer: Self-pay | Admitting: *Deleted

## 2019-02-07 NOTE — Telephone Encounter (Signed)
Dr. Loletha Carrow,   I received a phone call from Dr. Jimmye Norman from [place] who reviewed your office note but wanted to communicate corrected information mistakenly provided by the patient in your office visit with her on 02/03/2019.   1. The patient is taking Senna S 2 tabs PO QHS PRN (only q3 days if she has not had a BM). The patient relayed to you that she takes Dulcolax, this is incorrect. Per Dr. Jimmye Norman, she does not take the Senna as directed. Dr. Jimmye Norman directed the patient to take 1 tab at a time (as 2 often induces diarrhea). They are attempting to find a therapeutic dose that will not induce diarrhea.  2. The patient has already been seen by ENT. She is on "maximum therapy" but continues to have chronic symptoms.   3. The patient was diagnosed with IB (alternating constipation/diarrhea) while she was living in [place]. Dr. Jimmye Norman estimates the patient was diagnosed at least 2 years ago but not more than 5 years ago.   4. The patient has fibromyalgia. "Symptoms are functional." She just started on Lyrica (10/16) and is unsure if this has affected her bowel habits. The patient comes to see Dr. Jimmye Norman every 3-6 months and has not reported any change in her bowel habits in the last 2 years.   5. On Cymbalta for 2 years.   6. She was also taken off anticholinergic medications and calcium to try and improve bowel function.   7. Dr. Jimmye Norman is hoping for a "clean" colonoscopy without the discovery of new polyps and has provided her office number if you would like to discuss [phone]).

## 2019-02-10 NOTE — Telephone Encounter (Signed)
Thank ou for the add'l clinical information - [facility] clinic notes were not available the day of recent office consult.  I have copied your note with that info as an addendum to my recent note.  Colonoscopy is schedule for next month - I will call Dr. Jimmye Norman afterwards if necessary.

## 2019-02-10 NOTE — Telephone Encounter (Signed)
Noted  

## 2019-02-13 ENCOUNTER — Telehealth: Payer: Self-pay

## 2019-02-13 NOTE — Telephone Encounter (Signed)
PCC's please advise.  

## 2019-02-14 ENCOUNTER — Telehealth: Payer: Self-pay | Admitting: Internal Medicine

## 2019-02-14 NOTE — Telephone Encounter (Signed)
Pt's PFT was resched by Ewell Poe on 11/13.

## 2019-02-18 NOTE — Telephone Encounter (Signed)
[facility] called & rescheduled pft from 11/13.  Covid test has been scheduled as well.  Nothing further needed.

## 2019-02-21 ENCOUNTER — Other Ambulatory Visit: Payer: Self-pay | Admitting: Internal Medicine

## 2019-02-21 ENCOUNTER — Ambulatory Visit
Admission: RE | Admit: 2019-02-21 | Discharge: 2019-02-21 | Disposition: A | Discharge status: Home / Self Care | Payer: Medicare (Managed Care) | Source: Ambulatory Visit | Attending: Internal Medicine | Admitting: Internal Medicine

## 2019-02-21 DIAGNOSIS — R52 Pain, unspecified: Secondary | ICD-10-CM

## 2019-02-21 DIAGNOSIS — R609 Edema, unspecified: Secondary | ICD-10-CM

## 2019-02-21 DIAGNOSIS — T1490XA Injury, unspecified, initial encounter: Secondary | ICD-10-CM

## 2019-03-04 ENCOUNTER — Other Ambulatory Visit: Payer: Self-pay

## 2019-03-04 ENCOUNTER — Encounter (HOSPITAL_COMMUNITY): Payer: Self-pay | Admitting: *Deleted

## 2019-03-04 NOTE — Progress Notes (Signed)
Pt denies SOB, chest pain, and being under the care of a cardiologist.Pt stated that she is under the care of Dr. Jimmye Norman, PCP at [facility]. Pt denies having a stress test, echo and cardiac cath. Pt denies having an EKG within the last year. Pt denies recent labs. Pt made aware to stop taking  Aspirin (unless otherwise advised by surgeon), vitamins, fish oil and herbal medications. Do not take any NSAIDs ie: Ibuprofen, Advil, Naproxen (Aleve), Motrin, BC and Goody Powder. Pt verbalized understanding of all pre-op instructions.

## 2019-03-04 NOTE — H&P (Signed)
Danielle Clay is an 68 y.o. female.   Chief Complaint: RIGHT INDEX FINGER INJURY  HPI: The patient is a 68y/o right hand dominant female who sustained an injury to the right index finger several months ago. She did not seek treatment initially since she just thought that she had jammed it. The swelling, stiffness, and pain did not resolve, therefore she was seen in our office. We discussed the malunion of the proximal phalanx and continued closed treatment versus surgical intervention. She would like to proceed with surgery.  She is here today for surgery.  She denies chest pain, shortness of breath, fever, chills, nausea, vomiting, diarrhea.    Past Medical History:  Diagnosis Date  . Allergy   . Arthritis   . Bronchial asthma   . Colon polyps   . COPD (chronic obstructive pulmonary disease) ([place])   . Fibromyalgia   . GERD (gastroesophageal reflux disease)   . Hypertension   . Migraine     Past Surgical History:  Procedure Laterality Date  . BREAST EXCISIONAL BIOPSY Right   . CYST REMOVAL HAND     Under left breast  . GALLBLADDER SURGERY    . TONSILLECTOMY AND ADENOIDECTOMY      Family History  Problem Relation Age of Onset  . Diabetes Mother   . Anemia Mother   . HIV Sister   . Heart Problems Brother   . Asthma Brother   . HIV Brother   . HIV Brother    Social History:  reports that she has been smoking. She has never used smokeless tobacco. She reports previous drug use. She reports that she does not drink alcohol.  Allergies:  Allergies  Allergen Reactions  . Penicillins Swelling  . Percocet [Oxycodone-Acetaminophen] Itching    No medications prior to admission.    No results found for this or any previous visit (from the past 48 hour(s)). No results found.  ROS NO RECENT ILLNESSES OR HOSPITALIZATIONS  There were no vitals taken for this visit. Physical Exam  General Appearance:  Alert, cooperative, no distress, appears stated age  Head:   Normocephalic, without obvious abnormality, atraumatic  Eyes:  Pupils equal, conjunctiva/corneas clear,         Throat: Lips, mucosa, and tongue normal; teeth and gums normal  Neck: No visible masses     Lungs:   respirations unlabored  Chest Wall:  No tenderness or deformity  Heart:  Regular rate and rhythm,  Abdomen:   Soft, non-tender,         Extremities: RUE - SWELLING OF THE INDEX FINGER WITHOUT ERYTHEMA OR OPEN WOUNDS. SLIGHT ULNAR DEVIATION AT THE PROXIMAL PHALANX. SENSATION INTACT TO LIGHT TOUCH DISTALLY. CAPILLARY REFILL LESS THAN 2 SECONDS. TENDERNESS TO PALPATION OF THE INDEX FINGER. ALL OTHER DIGITS ARE NONTENDER. LIMITED FLEXION OF THE INDEX FINGER WHILE BEING ABLE TO FULLY EXTEND. FULL RANGE OF MOTION OF ALL OTHER DIGITS.   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 THE RISKS AND BENEFITS OF SURGERY INCLUDE BUT NOT LIMITED TO BLEEDING INFECTION, DAMAGE TO NEARBY NERVES ARTERIES TENDONS, FAILURE OF SURGERY TO ACCOMPLISH ITS INTENDED GOALS, PERSISTENT SYMPTOMS AND NEED FOR FURTHER SURGICAL INTERVENTION. WITH  THIS IN MIND WE WILL PROCEED. I HAVE DISCUSSED WITH THE PATIENT THE PRE AND POSTOPERATIVE REGIMEN AND THE DOS AND DON'TS. PT VOICED UNDERSTANDING AND INFORMED CONSENT SIGNED.  R/B/A DISCUSSED WITH PT IN OFFICE.  PT VOICED UNDERSTANDING OF PLAN CONSENT SIGNED DAY OF SURGERY PT SEEN AND EXAMINED PRIOR TO OPERATIVE PROCEDURE/DAY OF SURGERY SITE MARKED. QUESTIONS ANSWERED WILL GO HOME FOLLOWING SURGERY  Danielle Clay [facility] MD 03/05/19  Danielle Clay 03/04/2019, 5:15 PM

## 2019-03-05 ENCOUNTER — Other Ambulatory Visit: Payer: Self-pay

## 2019-03-05 ENCOUNTER — Ambulatory Visit (HOSPITAL_COMMUNITY): Payer: Medicare (Managed Care) | Admitting: Anesthesiology

## 2019-03-05 ENCOUNTER — Encounter (HOSPITAL_COMMUNITY)
Admission: RE | Disposition: A | Discharge status: Home / Self Care | Payer: Self-pay | Source: Home / Self Care | Attending: Orthopedic Surgery

## 2019-03-05 ENCOUNTER — Other Ambulatory Visit (HOSPITAL_COMMUNITY)
Admission: RE | Admit: 2019-03-05 | Discharge: 2019-03-05 | Disposition: A | Discharge status: Home / Self Care | Payer: Medicare (Managed Care) | Source: Ambulatory Visit | Attending: Orthopedic Surgery | Admitting: Orthopedic Surgery

## 2019-03-05 ENCOUNTER — Encounter (HOSPITAL_COMMUNITY): Payer: Self-pay | Admitting: *Deleted

## 2019-03-05 ENCOUNTER — Ambulatory Visit (HOSPITAL_COMMUNITY)
Admission: RE | Admit: 2019-03-05 | Discharge: 2019-03-05 | Disposition: A | Discharge status: Home / Self Care | Payer: Medicare (Managed Care) | Attending: Orthopedic Surgery | Admitting: Orthopedic Surgery

## 2019-03-05 DIAGNOSIS — J449 Chronic obstructive pulmonary disease, unspecified: Secondary | ICD-10-CM | POA: Insufficient documentation

## 2019-03-05 DIAGNOSIS — Z880 Allergy status to penicillin: Secondary | ICD-10-CM | POA: Insufficient documentation

## 2019-03-05 DIAGNOSIS — Y9389 Activity, other specified: Secondary | ICD-10-CM | POA: Insufficient documentation

## 2019-03-05 DIAGNOSIS — M797 Fibromyalgia: Secondary | ICD-10-CM | POA: Insufficient documentation

## 2019-03-05 DIAGNOSIS — Z885 Allergy status to narcotic agent status: Secondary | ICD-10-CM | POA: Diagnosis not present

## 2019-03-05 DIAGNOSIS — S62610A Displaced fracture of proximal phalanx of right index finger, initial encounter for closed fracture: Secondary | ICD-10-CM | POA: Insufficient documentation

## 2019-03-05 DIAGNOSIS — F17200 Nicotine dependence, unspecified, uncomplicated: Secondary | ICD-10-CM | POA: Diagnosis not present

## 2019-03-05 DIAGNOSIS — S62610P Displaced fracture of proximal phalanx of right index finger, subsequent encounter for fracture with malunion: Secondary | ICD-10-CM

## 2019-03-05 DIAGNOSIS — W230XXA Caught, crushed, jammed, or pinched between moving objects, initial encounter: Secondary | ICD-10-CM | POA: Insufficient documentation

## 2019-03-05 DIAGNOSIS — M1990 Unspecified osteoarthritis, unspecified site: Secondary | ICD-10-CM | POA: Insufficient documentation

## 2019-03-05 DIAGNOSIS — Z20828 Contact with and (suspected) exposure to other viral communicable diseases: Secondary | ICD-10-CM | POA: Insufficient documentation

## 2019-03-05 DIAGNOSIS — I10 Essential (primary) hypertension: Secondary | ICD-10-CM | POA: Insufficient documentation

## 2019-03-05 HISTORY — DX: Other injury of unspecified body region, initial encounter: T14.8XXA

## 2019-03-05 HISTORY — PX: CLOSED REDUCTION FINGER WITH PERCUTANEOUS PINNING: SHX5612

## 2019-03-05 HISTORY — DX: Constipation, unspecified: K59.00

## 2019-03-05 HISTORY — DX: Unspecified cataract: H26.9

## 2019-03-05 HISTORY — DX: Presence of spectacles and contact lenses: Z97.3

## 2019-03-05 LAB — COMPREHENSIVE METABOLIC PANEL
ALT: 16 U/L (ref 0–44)
AST: 20 U/L (ref 15–41)
Albumin: 3.6 g/dL (ref 3.5–5.0)
Alkaline Phosphatase: 54 U/L (ref 38–126)
Anion gap: 10 (ref 5–15)
BUN: 6 mg/dL — ABNORMAL LOW (ref 8–23)
CO2: 25 mmol/L (ref 22–32)
Calcium: 9.9 mg/dL (ref 8.9–10.3)
Chloride: 101 mmol/L (ref 98–111)
Creatinine, Ser: 0.48 mg/dL (ref 0.44–1.00)
GFR calc Af Amer: >60 mL/min (ref >60)
GFR calc non Af Amer: >60 mL/min (ref >60)
Glucose, Bld: 105 mg/dL — ABNORMAL HIGH (ref 70–99)
Potassium: 3.8 mmol/L (ref 3.5–5.1)
Sodium: 136 mmol/L (ref 135–145)
Total Bilirubin: 0.5 mg/dL (ref 0.3–1.2)
Total Protein: 6.7 g/dL (ref 6.5–8.1)

## 2019-03-05 LAB — CBC
HCT: 46.4 % — ABNORMAL HIGH (ref 36.0–46.0)
Hemoglobin: 15.1 g/dL — ABNORMAL HIGH (ref 12.0–15.0)
MCH: 31.5 pg (ref 26.0–34.0)
MCHC: 32.5 g/dL (ref 30.0–36.0)
MCV: 96.9 fL (ref 80.0–100.0)
Platelets: 206 10*3/uL (ref 150–400)
RBC: 4.79 MIL/uL (ref 3.87–5.11)
RDW: 12.6 % (ref 11.5–15.5)
WBC: 8.5 10*3/uL (ref 4.0–10.5)
nRBC: 0 % (ref 0.0–0.2)

## 2019-03-05 LAB — SARS CORONAVIRUS 2 BY RT PCR (HOSPITAL ORDER, PERFORMED IN ~~LOC~~ HOSPITAL LAB): SARS Coronavirus 2: NEGATIVE

## 2019-03-05 SURGERY — CLOSED REDUCTION, FINGER, WITH PERCUTANEOUS PINNING
Anesthesia: Monitor Anesthesia Care | Site: Finger | Laterality: Right

## 2019-03-05 MED ORDER — MIDAZOLAM HCL 2 MG/2ML IJ SOLN
INTRAMUSCULAR | Status: AC
  Administered 2019-03-05: 1 mg via INTRAVENOUS
  Filled 2019-03-05: qty 2

## 2019-03-05 MED ORDER — ONDANSETRON HCL 4 MG/2ML IJ SOLN: 4.0000 mg | Freq: Once | INTRAMUSCULAR | Status: DC | PRN

## 2019-03-05 MED ORDER — DEXAMETHASONE SODIUM PHOSPHATE 10 MG/ML IJ SOLN
INTRAMUSCULAR | Status: DC | PRN
  Administered 2019-03-05: 10 mg

## 2019-03-05 MED ORDER — LACTATED RINGERS IV SOLN
INTRAVENOUS | Status: DC
  Administered 2019-03-05 (×3): via INTRAVENOUS

## 2019-03-05 MED ORDER — PHENYLEPHRINE 40 MCG/ML (10ML) SYRINGE FOR IV PUSH (FOR BLOOD PRESSURE SUPPORT)
PREFILLED_SYRINGE | INTRAVENOUS | Status: DC | PRN
  Administered 2019-03-05 (×5): 80 ug via INTRAVENOUS

## 2019-03-05 MED ORDER — ONDANSETRON HCL 4 MG/2ML IJ SOLN
INTRAMUSCULAR | Status: AC
  Filled 2019-03-05: qty 2

## 2019-03-05 MED ORDER — FENTANYL CITRATE (PF) 250 MCG/5ML IJ SOLN
INTRAMUSCULAR | Status: AC
  Filled 2019-03-05: qty 5

## 2019-03-05 MED ORDER — LIDOCAINE 2% (20 MG/ML) 5 ML SYRINGE
INTRAMUSCULAR | Status: AC
  Filled 2019-03-05: qty 5

## 2019-03-05 MED ORDER — HYDROCODONE-ACETAMINOPHEN 7.5-325 MG PO TABS: 1.0000 | ORAL_TABLET | Freq: Once | ORAL | Status: DC | PRN

## 2019-03-05 MED ORDER — FENTANYL CITRATE (PF) 100 MCG/2ML IJ SOLN
INTRAMUSCULAR | Status: AC
  Administered 2019-03-05: 50 ug via INTRAVENOUS
  Filled 2019-03-05: qty 2

## 2019-03-05 MED ORDER — 0.9 % SODIUM CHLORIDE (POUR BTL) OPTIME
TOPICAL | Status: DC | PRN
  Administered 2019-03-05: 1000 mL

## 2019-03-05 MED ORDER — MIDAZOLAM HCL 5 MG/5ML IJ SOLN
INTRAMUSCULAR | Status: DC | PRN
  Administered 2019-03-05 (×2): 1 mg via INTRAVENOUS

## 2019-03-05 MED ORDER — PROPOFOL 1000 MG/100ML IV EMUL
INTRAVENOUS | Status: AC
  Filled 2019-03-05: qty 100

## 2019-03-05 MED ORDER — PROPOFOL 500 MG/50ML IV EMUL
INTRAVENOUS | Status: DC | PRN
  Administered 2019-03-05: 100 ug/kg/min via INTRAVENOUS

## 2019-03-05 MED ORDER — ROPIVACAINE HCL 5 MG/ML IJ SOLN
INTRAMUSCULAR | Status: DC | PRN
  Administered 2019-03-05: 20 mL via PERINEURAL

## 2019-03-05 MED ORDER — LIDOCAINE 20MG/ML (2%) 15 ML SYRINGE OPTIME
INTRAMUSCULAR | Status: DC | PRN
  Administered 2019-03-05: 40 mg via INTRAVENOUS

## 2019-03-05 MED ORDER — PROPOFOL 10 MG/ML IV BOLUS
INTRAVENOUS | Status: AC
  Filled 2019-03-05: qty 20

## 2019-03-05 MED ORDER — MIDAZOLAM HCL 2 MG/2ML IJ SOLN
INTRAMUSCULAR | Status: AC
  Filled 2019-03-05: qty 2

## 2019-03-05 MED ORDER — VANCOMYCIN HCL IN DEXTROSE 1-5 GM/200ML-% IV SOLN
1000.0000 mg | INTRAVENOUS | Status: AC
  Administered 2019-03-05: 1000 mg via INTRAVENOUS

## 2019-03-05 MED ORDER — FENTANYL CITRATE (PF) 100 MCG/2ML IJ SOLN
50.0000 ug | Freq: Once | INTRAMUSCULAR | Status: AC
  Administered 2019-03-05: 14:00:00 50 ug via INTRAVENOUS

## 2019-03-05 MED ORDER — HYDROMORPHONE HCL 1 MG/ML IJ SOLN: 0.2500 mg | INTRAMUSCULAR | Status: DC | PRN

## 2019-03-05 MED ORDER — CHLORHEXIDINE GLUCONATE 4 % EX LIQD: 60.0000 mL | Freq: Once | CUTANEOUS | Status: DC

## 2019-03-05 MED ORDER — ONDANSETRON HCL 4 MG/2ML IJ SOLN
INTRAMUSCULAR | Status: DC | PRN
  Administered 2019-03-05: 4 mg via INTRAVENOUS

## 2019-03-05 MED ORDER — MIDAZOLAM HCL 2 MG/2ML IJ SOLN
1.0000 mg | Freq: Once | INTRAMUSCULAR | Status: AC
  Administered 2019-03-05: 14:00:00 1 mg via INTRAVENOUS

## 2019-03-05 SURGICAL SUPPLY — 54 items
BENZOIN TINCTURE PRP APPL 2/3 (GAUZE/BANDAGES/DRESSINGS) IMPLANT
BIT DRILL 1.1 (BIT) ×1
BIT DRILL 60X20X1.1XQC TMX (BIT) IMPLANT
BIT DRL 60X20X1.1XQC TMX (BIT) ×1
BLADE CLIPPER SURG (BLADE) IMPLANT
BNDG CONFORM 3 STRL LF (GAUZE/BANDAGES/DRESSINGS) ×1 IMPLANT
BNDG ELASTIC 2X5.8 VLCR STR LF (GAUZE/BANDAGES/DRESSINGS) ×1 IMPLANT
BNDG ELASTIC 3X5.8 VLCR STR LF (GAUZE/BANDAGES/DRESSINGS) ×2 IMPLANT
BNDG ELASTIC 4X5.8 VLCR STR LF (GAUZE/BANDAGES/DRESSINGS) IMPLANT
BNDG ESMARK 4X9 LF (GAUZE/BANDAGES/DRESSINGS) ×1 IMPLANT
BNDG GAUZE ELAST 4 BULKY (GAUZE/BANDAGES/DRESSINGS) ×2 IMPLANT
CORD BIPOLAR FORCEPS 12FT (ELECTRODE) ×1 IMPLANT
COVER SURGICAL LIGHT HANDLE (MISCELLANEOUS) ×2 IMPLANT
COVER WAND RF STERILE (DRAPES) ×2 IMPLANT
CUFF TOURN SGL QUICK 18X4 (TOURNIQUET CUFF) IMPLANT
CUFF TOURN SGL QUICK 24 (TOURNIQUET CUFF)
CUFF TRNQT CYL 24X4X16.5-23 (TOURNIQUET CUFF) IMPLANT
DRAPE OEC MINIVIEW 54X84 (DRAPES) ×2 IMPLANT
DRSG ADAPTIC 3X8 NADH LF (GAUZE/BANDAGES/DRESSINGS) ×1 IMPLANT
DRSG EMULSION OIL 3X3 NADH (GAUZE/BANDAGES/DRESSINGS) IMPLANT
GAUZE SPONGE 4X4 12PLY STRL (GAUZE/BANDAGES/DRESSINGS) IMPLANT
GAUZE XEROFORM 1X8 LF (GAUZE/BANDAGES/DRESSINGS) IMPLANT
GLOVE BIOGEL PI IND STRL 8.5 (GLOVE) ×1 IMPLANT
GLOVE BIOGEL PI INDICATOR 8.5 (GLOVE) ×1
GLOVE SURG ORTHO 8.0 STRL STRW (GLOVE) ×2 IMPLANT
GOWN STRL REUS W/ TWL LRG LVL3 (GOWN DISPOSABLE) ×2 IMPLANT
GOWN STRL REUS W/TWL LRG LVL3 (GOWN DISPOSABLE) ×2
KIT BASIN OR (CUSTOM PROCEDURE TRAY) ×2 IMPLANT
KIT TURNOVER KIT B (KITS) ×2 IMPLANT
LOCK SCREW 1.5X15MM (Screw) ×2 IMPLANT
NS IRRIG 1000ML POUR BTL (IV SOLUTION) ×2 IMPLANT
PACK ORTHO EXTREMITY (CUSTOM PROCEDURE TRAY) ×2 IMPLANT
PAD ARMBOARD 7.5X6 YLW CONV (MISCELLANEOUS) ×4 IMPLANT
PLATE LOCK WEB 1.5 SM (Plate) ×1 IMPLANT
PUTTY DBM STAGRAFT PLUS 2CC (Putty) ×1 IMPLANT
SCREW L 1.5X14 (Screw) ×2 IMPLANT
SCREW LOCK 1.5X15MM (Screw) IMPLANT
SCREW LOCKING 1.5X10 (Screw) ×1 IMPLANT
SCREW NL 1.5X11 WRIST (Screw) ×2 IMPLANT
SCREW NONIOC 1.5 10M (Screw) ×2 IMPLANT
SOAP 2 % CHG 4 OZ (WOUND CARE) ×2 IMPLANT
STRIP CLOSURE SKIN 1/2X4 (GAUZE/BANDAGES/DRESSINGS) IMPLANT
SUCTION FRAZIER HANDLE 10FR (MISCELLANEOUS)
SUCTION TUBE FRAZIER 10FR DISP (MISCELLANEOUS) IMPLANT
SUT ETHIBOND 4 0 TF (SUTURE) ×1 IMPLANT
SUT MNCRL AB 3-0 PS2 18 (SUTURE) ×1 IMPLANT
SUT PROLENE 3 0 PS 2 (SUTURE) IMPLANT
SUT PROLENE 4 0 P 3 18 (SUTURE) IMPLANT
SUT PROLENE 4 0 PS 2 18 (SUTURE) ×1 IMPLANT
SUT VICRYL 4-0 PS2 18IN ABS (SUTURE) IMPLANT
TOWEL GREEN STERILE (TOWEL DISPOSABLE) ×2 IMPLANT
TOWEL GREEN STERILE FF (TOWEL DISPOSABLE) ×2 IMPLANT
TUBE CONNECTING 12X1/4 (SUCTIONS) ×1 IMPLANT
WATER STERILE IRR 1000ML POUR (IV SOLUTION) ×2 IMPLANT

## 2019-03-05 NOTE — Anesthesia Preprocedure Evaluation (Addendum)
Anesthesia Evaluation  Patient identified by MRN, date of birth, ID band Patient awake    Reviewed: Allergy & Precautions, NPO status , Patient's Chart, lab work & pertinent test results  Airway Mallampati: II  TM Distance: >3 FB Neck ROM: Full    Dental no notable dental hx. (+) Dental Advisory Given   Pulmonary COPD, Current Smoker and Patient abstained from smoking.,    Pulmonary exam normal  + decreased breath sounds      Cardiovascular hypertension, Pt. on medications negative cardio ROS Normal cardiovascular exam Rhythm:Regular Rate:Normal     Neuro/Psych  Headaches, negative psych ROS   GI/Hepatic GERD  Medicated and Controlled,(+)     substance abuse  marijuana use,   Endo/Other  negative endocrine ROS  Renal/GU negative Renal ROS  negative genitourinary   Musculoskeletal  (+) Arthritis , Osteoarthritis,  Fibromyalgia -  Abdominal Normal abdominal exam  (+)   Peds negative pediatric ROS (+)  Hematology negative hematology ROS (+)   Anesthesia Other Findings Right index finger fx  Reproductive/Obstetrics negative OB ROS                          Anesthesia Physical Anesthesia Plan  ASA: III  Anesthesia Plan: Regional and MAC   Post-op Pain Management:  Regional for Post-op pain   Induction:   PONV Risk Score and Plan: 1 and Propofol infusion, TIVA and Treatment may vary due to age or medical condition  Airway Management Planned: Simple Face Mask and Natural Airway  Additional Equipment: None  Intra-op Plan:   Post-operative Plan:   Informed Consent: I have reviewed the patients History and Physical, chart, labs and discussed the procedure including the risks, benefits and alternatives for the proposed anesthesia with the patient or authorized representative who has indicated his/her understanding and acceptance.       Plan Discussed with: CRNA  Anesthesia Plan  Comments:         Anesthesia Quick Evaluation

## 2019-03-05 NOTE — Anesthesia Procedure Notes (Signed)
Anesthesia Regional Block: Supraclavicular block   Pre-Anesthetic Checklist: ,, timeout performed, Correct Patient, Correct Site, Correct Laterality, Correct Procedure, Correct Position, site marked, Risks and benefits discussed,  Surgical consent,  Pre-op evaluation,  At surgeon's request and post-op pain management  Laterality: Right  Prep: Maximum Sterile Barrier Precautions used, chloraprep       Needles:  Injection technique: Single-shot  Needle Type: Echogenic Stimulator Needle     Needle Length: 9cm  Needle Gauge: 22     Additional Needles:   Procedures:,,,, ultrasound used (permanent image in chart),,,,  Narrative:  Start time: 03/05/2019 2:10 PM End time: 03/05/2019 2:15 PM Injection made incrementally with aspirations every 5 mL.  Performed by: Personally  Anesthesiologist: Pervis Hocking, DO  Additional Notes: Monitors applied. No increased pain on injection. No increased resistance to injection. Injection made in 5cc increments. Good needle visualization. Patient tolerated procedure well.

## 2019-03-05 NOTE — Anesthesia Postprocedure Evaluation (Signed)
Anesthesia Post Note  Patient: [name]  Procedure(s) Performed: CLOSED REDUCTION FINGER WITH PERCUTANEOUS PINNING AND OPEN REDUCTION AND INTERNAL FIXTATION RIGHT INDEX FINGER (Right Finger)     Patient location during evaluation: PACU Anesthesia Type: Regional and MAC Level of consciousness: awake and alert Pain management: pain level controlled Vital Signs Assessment: post-procedure vital signs reviewed and stable Respiratory status: spontaneous breathing, nonlabored ventilation and respiratory function stable Cardiovascular status: blood pressure returned to baseline and stable Postop Assessment: no apparent nausea or vomiting Anesthetic complications: no    Last Vitals:  Vitals:   03/05/19 1612 03/05/19 1631  BP: 116/64   Pulse: 86   Resp: 16   Temp:  36.5 C  SpO2: 96%     Last Pain:  Vitals:   03/05/19 1600  TempSrc:   PainSc: 0-No pain                 Jarome Matin Carlo Lorson

## 2019-03-05 NOTE — Anesthesia Procedure Notes (Signed)
Procedure Name: MAC Date/Time: 03/05/2019 2:52 PM Performed by: Renato Shin, CRNA Pre-anesthesia Checklist: Patient identified, Emergency Drugs available, Suction available and Patient being monitored Patient Re-evaluated:Patient Re-evaluated prior to induction Oxygen Delivery Method: Nasal cannula Preoxygenation: Pre-oxygenation with 100% oxygen Induction Type: IV induction Placement Confirmation: positive ETCO2 and breath sounds checked- equal and bilateral Dental Injury: Teeth and Oropharynx as per pre-operative assessment

## 2019-03-05 NOTE — Discharge Instructions (Signed)
KEEP BANDAGE CLEAN AND DRY CALL OFFICE FOR F/U APPT [phone] IN 13 DAYS KEEP HAND ELEVATED ABOVE HEART OK TO APPLY ICE TO OPERATIVE AREA CONTACT OFFICE IF ANY WORSENING PAIN OR CONCERNS.  

## 2019-03-05 NOTE — Op Note (Signed)
PREOPERATIVE DIAGNOSIS: Right index finger proximal phalanx malunion  POSTOPERATIVE DIAGNOSIS: Same  ATTENDING SURGEON: Dr. Iran Planas who scrubbed and present for the entire procedure  ASSISTANT SURGEON: Gertie Fey, [facility] who scrubbed and necessary for takedown of the malunion internal fixation closure and splinting in a timely fashion  ANESTHESIA: Regional with IV sedation  OPERATIVE PROCEDURE: #1: Repair right index finger malunion with internal fixation, proximal phalanx #2: Radiographs 3 views right index finger  IMPLANTS: Biomet 1.5 mm plate small T plate with a combination of locking nonlocking screws  RADIOGRAPHIC INTERPRETATION: AP lateral oblique views of the finger do show the dorsal plate fixation place in good position  SURGICAL INDICATIONS: Patient is a right-hand-dominant female sustained closed injury to her right index finger over 3 months ago.  Patient was noted malunion.  Patient elected procedure risks of surgery include but not limited to bleeding infection damage nearby nerves arteries or tendons loss of motion of the wrist due to incomplete relief of symptoms and need for further surgical intervention.  SURGICAL TECHNIQUE: Patient was palpated via the preoperative holding area marked for marker made on the right index finger indicate correct operative site.  Patient brought back operating placed supine on anesthesia table where the regional anesthetic and been administered.  Preoperative antibiotics were given regarding skin incision.  Well-padded tourniquet placed on the right brachium site with appropriate drape.  Right extremities and prepped and draped normal sterile fashion.  A timeout was called the correct site and planned procedure.  Longitudinal incision made directly over the index finger proximal phalanx.  Second third of the skin and subcutaneous tissue.  Extensor interval was split longitudinally exposing the periosteal layer which was then elevated with  thick flaps.  The malunion was then identified.  Takedown of the fracture callus in the malunion was then done with small instrumentation.  The fracture line was then recreated.  After takedown of the nascent malunion.  The wound was then thoroughly irrigated.  Open reduction was then performed.  The plate was then applied to the dorsal surface of the bone and held proximally distally with K wires.  Its position with C-arm.  After adequate plate position screw fixation was then carried out with combination of locking nonlocking screws proximally distally to stabilize the malunion.  The area of the metaphysis was in the 1 cc of Biomet stay graft bone graft substitute.  The wound was then thoroughly irrigated.  Final radiographs were then obtained.  Following this periosteal layer was Monocryl suture.  The extensor tendon was then closed with 4 Ethibond suture.  Adaptic dressing a sterile applied.  The patient finger gutter splint patient is a taken good condition.  POSTOPERATIVE PLAN: Patient be discharged to ambulate.  See him back in the office in 12 days for wound check suture removal sent downstairs to see her therapist for hand-based splint begin active range of motion as per protocol.  Radiographs at each visit.

## 2019-03-05 NOTE — Transfer of Care (Signed)
Immediate Anesthesia Transfer of Care Note  Patient: Nirali Magouirk  Procedure(s) Performed: CLOSED REDUCTION FINGER WITH PERCUTANEOUS PINNING AND OPEN REDUCTION AND INTERNAL FIXTATION RIGHT INDEX FINGER (Right Finger)  Patient Location: PACU  Anesthesia Type:MAC combined with regional for post-op pain  Level of Consciousness: awake, alert , oriented and patient cooperative  Airway & Oxygen Therapy: Patient Spontanous Breathing and Patient connected to nasal cannula oxygen  Post-op Assessment: Report given to RN and Post -op Vital signs reviewed and stable  Post vital signs: Reviewed and stable  Last Vitals:  Vitals Value Taken Time  BP 136/60 03/05/19 1631  Temp 36.5 C 03/05/19 1631  Pulse 83 03/05/19 1632  Resp 20 03/05/19 1632  SpO2 96 % 03/05/19 1632  Vitals shown include unvalidated device data.  Last Pain:  Vitals:   03/05/19 1600  TempSrc:   PainSc: 0-No pain      Patients Stated Pain Goal: 3 (82/64/15 8309)  Complications: No apparent anesthesia complications

## 2019-03-06 ENCOUNTER — Encounter (HOSPITAL_COMMUNITY): Payer: Self-pay | Admitting: Orthopedic Surgery

## 2019-03-17 ENCOUNTER — Encounter: Payer: Medicare (Managed Care) | Admitting: Gastroenterology

## 2019-04-11 ENCOUNTER — Inpatient Hospital Stay (HOSPITAL_COMMUNITY): Admission: RE | Admit: 2019-04-11 | Payer: Medicare (Managed Care) | Source: Ambulatory Visit

## 2019-04-14 ENCOUNTER — Other Ambulatory Visit (HOSPITAL_COMMUNITY)
Admission: RE | Admit: 2019-04-14 | Discharge: 2019-04-14 | Disposition: A | Discharge status: Home / Self Care | Payer: Medicare (Managed Care) | Source: Ambulatory Visit | Attending: Internal Medicine | Admitting: Internal Medicine

## 2019-04-14 DIAGNOSIS — Z01812 Encounter for preprocedural laboratory examination: Secondary | ICD-10-CM | POA: Insufficient documentation

## 2019-04-14 DIAGNOSIS — Z20822 Contact with and (suspected) exposure to covid-19: Secondary | ICD-10-CM | POA: Insufficient documentation

## 2019-04-14 LAB — SARS CORONAVIRUS 2 (TAT 6-24 HRS): SARS Coronavirus 2: NEGATIVE

## 2019-04-15 ENCOUNTER — Other Ambulatory Visit: Payer: Self-pay | Admitting: Internal Medicine

## 2019-04-15 DIAGNOSIS — R0609 Other forms of dyspnea: Secondary | ICD-10-CM

## 2019-04-16 ENCOUNTER — Other Ambulatory Visit: Payer: Self-pay

## 2019-04-16 ENCOUNTER — Ambulatory Visit (INDEPENDENT_AMBULATORY_CARE_PROVIDER_SITE_OTHER): Payer: Medicare (Managed Care) | Admitting: Internal Medicine

## 2019-04-16 DIAGNOSIS — R0600 Dyspnea, unspecified: Secondary | ICD-10-CM | POA: Diagnosis not present

## 2019-04-16 DIAGNOSIS — R0609 Other forms of dyspnea: Secondary | ICD-10-CM

## 2019-04-16 LAB — PULMONARY FUNCTION TEST
FEF 25-75 Pre: 0.9 L/sec
FEF2575-%Pred-Pre: 60 %
FEV1-%Pred-Pre: 96 %
FEV1-Pre: 1.5 L
FEV1FVC-%Pred-Pre: 87 %
FEV6-%Pred-Pre: 114 %
FEV6-Pre: 2.2 L
FEV6FVC-%Pred-Pre: 104 %
FVC-%Pred-Pre: 109 %
FVC-Pre: 2.21 L
Pre FEV1/FVC ratio: 68 %
Pre FEV6/FVC Ratio: 99 %

## 2019-04-16 NOTE — Progress Notes (Signed)
Spirometry done today. 

## 2019-07-01 ENCOUNTER — Other Ambulatory Visit: Payer: Self-pay

## 2019-07-01 ENCOUNTER — Ambulatory Visit (AMBULATORY_SURGERY_CENTER): Payer: Self-pay

## 2019-07-01 VITALS — Temp 97.5°F | Ht 61.0 in | Wt 153.6 lb

## 2019-07-01 DIAGNOSIS — K5909 Other constipation: Secondary | ICD-10-CM

## 2019-07-01 DIAGNOSIS — Z860100 Personal history of colon polyps, unspecified: Secondary | ICD-10-CM

## 2019-07-01 DIAGNOSIS — Z01818 Encounter for other preprocedural examination: Secondary | ICD-10-CM

## 2019-07-01 NOTE — Progress Notes (Signed)
No allergies to soy or egg Pt is not on blood thinners or diet pills Denies issues with sedation/intubation Denies atrial flutter/fib Denies constipation   Emmi instructions given to pt  Pt is aware of Covid safety and care partner requirements.  

## 2019-07-10 ENCOUNTER — Other Ambulatory Visit: Payer: Self-pay | Admitting: Gastroenterology

## 2019-07-10 ENCOUNTER — Ambulatory Visit (INDEPENDENT_AMBULATORY_CARE_PROVIDER_SITE_OTHER): Payer: Medicare (Managed Care)

## 2019-07-10 ENCOUNTER — Other Ambulatory Visit: Payer: Self-pay

## 2019-07-10 DIAGNOSIS — Z1159 Encounter for screening for other viral diseases: Secondary | ICD-10-CM

## 2019-07-11 LAB — SARS CORONAVIRUS 2 (TAT 6-24 HRS): SARS Coronavirus 2: NEGATIVE

## 2019-07-14 ENCOUNTER — Telehealth: Payer: Self-pay | Admitting: *Deleted

## 2019-07-14 NOTE — Telephone Encounter (Signed)
Patient has hx of constipation and was only prescribed a normal Miralax split  prep/  Dr Loletha Carrow advised pt should have been a two day prep. Patient will do normal Miralax split prep tonight and in the norning.  If her results are not clear she will call us at 7 AM Tuesday 4/13 so we can advise if she will need additional prepping.and be moved to a Wednesday appt. Pt  Verbalized understanding/ .  Questions ask Raquel Sarna or Erenest Rasher.

## 2019-07-15 ENCOUNTER — Ambulatory Visit (AMBULATORY_SURGERY_CENTER): Payer: Medicare (Managed Care) | Admitting: Gastroenterology

## 2019-07-15 ENCOUNTER — Other Ambulatory Visit: Payer: Self-pay

## 2019-07-15 ENCOUNTER — Encounter: Payer: Self-pay | Admitting: Gastroenterology

## 2019-07-15 VITALS — BP 109/80 | HR 84 | Temp 97.3°F | Resp 14 | Ht 61.75 in | Wt 153.6 lb

## 2019-07-15 DIAGNOSIS — D124 Benign neoplasm of descending colon: Secondary | ICD-10-CM

## 2019-07-15 DIAGNOSIS — D120 Benign neoplasm of cecum: Secondary | ICD-10-CM | POA: Diagnosis not present

## 2019-07-15 DIAGNOSIS — D123 Benign neoplasm of transverse colon: Secondary | ICD-10-CM

## 2019-07-15 DIAGNOSIS — D122 Benign neoplasm of ascending colon: Secondary | ICD-10-CM | POA: Diagnosis not present

## 2019-07-15 DIAGNOSIS — Z86010 Personal history of colonic polyps: Secondary | ICD-10-CM

## 2019-07-15 DIAGNOSIS — D125 Benign neoplasm of sigmoid colon: Secondary | ICD-10-CM

## 2019-07-15 MED ORDER — SODIUM CHLORIDE 0.9 % IV SOLN: 500.0000 mL | Freq: Once | INTRAVENOUS | Status: DC

## 2019-07-15 NOTE — Op Note (Signed)
[facility] Patient Name: Danielle Clay Procedure Date: 07/15/2019 9:28 AM MRN: [id number] Endoscopist: [place]. Loletha Carrow , MD Age: 69 Referring MD:  Date of Birth: 11-10-1950 Gender: Female Account #: [id number] Procedure:                Colonoscopy Indications:              Surveillance: Personal history of colonic polyps                            (unknown histology) on last colonoscopy more than 5                            years ago Medicines:                Monitored Anesthesia Care Procedure:                Pre-Anesthesia Assessment:                           - Prior to the procedure, a History and Physical                            was performed, and patient medications and                            allergies were reviewed. The patient's tolerance of                            previous anesthesia was also reviewed. The risks                            and benefits of the procedure and the sedation                            options and risks were discussed with the patient.                            All questions were answered, and informed consent                            was obtained. Prior Anticoagulants: The patient has                            taken no previous anticoagulant or antiplatelet                            agents except for aspirin. ASA Grade Assessment:                            III - A patient with severe systemic disease. After                            reviewing the risks and benefits, the patient was  deemed in satisfactory condition to undergo the                            procedure.                           After obtaining informed consent, the colonoscope                            was passed under direct vision. Throughout the                            procedure, the patient's blood pressure, pulse, and                            oxygen saturations were monitored continuously. The     Colonoscope was introduced through the anus and                            advanced to the the cecum, identified by                            appendiceal orifice and ileocecal valve. The                            colonoscopy was performed without difficulty. The                            patient tolerated the procedure well. The quality                            of the bowel preparation was good after lavage. The                            ileocecal valve, appendiceal orifice, and rectum                            were photographed. The quality of the bowel                            preparation was evaluated using the BBPS [facility]                            Bowel Preparation Scale) with scores of: Right                            Colon = 2, Transverse Colon = 2 and Left Colon = 2.                            The total BBPS score equals 6. The bowel                            preparation used was Miralax. Scope In: 9:39:08 AM Scope Out: 10:13:49 AM Scope Withdrawal Time:  0 hours 27 minutes 43 seconds  Total Procedure Duration: 0 hours 34 minutes 41 seconds  Findings:                 The digital rectal exam findings include decreased                            sphincter tone.                           A 15 mm polyp was found in the cecum. The polyp was                            sessile. The polyp was removed with a piecemeal                            technique using a cold snare. Resection and                            retrieval were complete.                           Three sessile polyps were found in the ascending                            colon. The polyps were 4 to 8 mm in size. These                            polyps were removed with a cold snare. Resection                            and retrieval were complete.                           Two sessile polyps were found in the transverse                            colon. The polyps were 3 to 6 mm in size. These                             polyps were removed with a cold snare. Resection                            and retrieval were complete.                           Three sessile polyps were found in the transverse                            colon. The polyps were 4 to 6 mm in size. These                            polyps were removed with a cold snare. Resection  and retrieval were complete.                           Three sessile polyps were found in the descending                            colon. The polyps were 2 to 5 mm in size. These                            polyps were removed with a cold snare. Resection                            and retrieval were complete.                           Multiple diverticula were found from hepatic                            flexure to sigmoid colon.                           Retroflexion in the rectum was not performed due to                            anatomy.                           The exam was otherwise without abnormality. Complications:            No immediate complications. Estimated Blood Loss:     Estimated blood loss was minimal. Impression:               - Decreased sphincter tone found on digital rectal                            exam.                           - One 15 mm polyp in the cecum, removed piecemeal                            using a cold snare. Resected and retrieved.                           - Three 4 to 8 mm polyps in the ascending colon,                            removed with a cold snare. Resected and retrieved.                           - Two 3 to 6 mm polyps in the transverse colon,                            removed with a cold snare. Resected and retrieved.                           -  Three 4 to 6 mm polyps in the transverse colon,                            removed with a cold snare. Resected and retrieved.                           - Three 2 to 5 mm polyps in the descending colon,                             removed with a cold snare. Resected and retrieved.                           - Diverticulosis from hepatic flexure to sigmoid                            colon.                           - The examination was otherwise normal. Recommendation:           - Patient has a contact number available for                            emergencies. The signs and symptoms of potential                            delayed complications were discussed with the                            patient. Return to normal activities tomorrow.                            Written discharge instructions were provided to the                            patient.                           - Resume previous diet.                           - Continue present medications.                           - Await pathology results.                           - Repeat colonoscopy in 1 year for surveillance. Jamisyn Langer L. Loletha Carrow, MD 07/15/2019 10:22:39 AM This report has been signed electronically.

## 2019-07-15 NOTE — Progress Notes (Signed)
Called to room to assist during endoscopic procedure.  Patient ID and intended procedure confirmed with present staff. Received instructions for my participation in the procedure from the performing physician.  

## 2019-07-15 NOTE — Patient Instructions (Signed)
Information on polyps and diverticulosis given to you today.  Await pathology results.  Repeat colonoscopy in 1 year.  YOU HAD AN ENDOSCOPIC PROCEDURE TODAY AT [facility]:   Refer to the procedure report that was given to you for any specific questions about what was found during the examination.  If the procedure report does not answer your questions, please call your gastroenterologist to clarify.  If you requested that your care partner not be given the details of your procedure findings, then the procedure report has been included in a sealed envelope for you to review at your convenience later.  YOU SHOULD EXPECT: Some feelings of bloating in the abdomen. Passage of more gas than usual.  Walking can help get rid of the air that was put into your GI tract during the procedure and reduce the bloating. If you had a lower endoscopy (such as a colonoscopy or flexible sigmoidoscopy) you may notice spotting of blood in your stool or on the toilet paper. If you underwent a bowel prep for your procedure, you may not have a normal bowel movement for a few days.  Please Note:  You might notice some irritation and congestion in your nose or some drainage.  This is from the oxygen used during your procedure.  There is no need for concern and it should clear up in a day or so.  SYMPTOMS TO REPORT IMMEDIATELY:   Following lower endoscopy (colonoscopy or flexible sigmoidoscopy):  Excessive amounts of blood in the stool  Significant tenderness or worsening of abdominal pains  Swelling of the abdomen that is new, acute  Fever of 100F or higher   For urgent or emergent issues, a gastroenterologist can be reached at any hour by calling [phone]. Do not use MyChart messaging for urgent concerns.    DIET:  We do recommend a small meal at first, but then you may proceed to your regular diet.  Drink plenty of fluids but you should avoid alcoholic beverages for 24 hours.  ACTIVITY:   You should plan to take it easy for the rest of today and you should NOT DRIVE or use heavy machinery until tomorrow (because of the sedation medicines used during the test).    FOLLOW UP: Our staff will call the number listed on your records 48-72 hours following your procedure to check on you and address any questions or concerns that you may have regarding the information given to you following your procedure. If we do not reach you, we will leave a message.  We will attempt to reach you two times.  During this call, we will ask if you have developed any symptoms of COVID 19. If you develop any symptoms (ie: fever, flu-like symptoms, shortness of breath, cough etc.) before then, please call [phone].  If you test positive for Covid 19 in the 2 weeks post procedure, please call and report this information to [place].    If any biopsies were taken you will be contacted by phone or by letter within the next 1-3 weeks.  Please call us at [phone] if you have not heard about the biopsies in 3 weeks.    SIGNATURES/CONFIDENTIALITY: You and/or your care partner have signed paperwork which will be entered into your electronic medical record.  These signatures attest to the fact that that the information above on your After Visit Summary has been reviewed and is understood.  Full responsibility of the confidentiality of this discharge information lies with you and/or  your care-partner. 

## 2019-07-15 NOTE — Progress Notes (Signed)
LC- temp CW- vitals

## 2019-07-15 NOTE — Progress Notes (Signed)
To PACU, VSS. Report to [email or web address] 

## 2019-07-17 ENCOUNTER — Telehealth: Payer: Self-pay | Admitting: *Deleted

## 2019-07-17 NOTE — Telephone Encounter (Signed)
  Follow up Call-  Call back number 07/15/2019  Post procedure Call Back phone  # [phone]  Permission to leave phone message Yes  Some recent data might be hidden     Patient questions:  Do you have a fever, pain , or abdominal swelling? No. Pain Score  0 *  Have you tolerated food without any problems? Yes.    Have you been able to return to your normal activities? Yes.    Do you have any questions about your discharge instructions: Diet   No. Medications  No. Follow up visit  No.  Do you have questions or concerns about your Care? No.  Actions: * If pain score is 4 or above: No action needed, pain <4.  1. Have you developed a fever since your procedure? no  2.   Have you had an respiratory symptoms (SOB or cough) since your procedure? no  3.   Have you tested positive for COVID 19 since your procedure no  4.   Have you had any family members/close contacts diagnosed with the COVID 19 since your procedure?  no   If yes to any of these questions please route to Joylene John, RN and Erenest Rasher, RN

## 2019-07-18 ENCOUNTER — Encounter: Payer: Self-pay | Admitting: Gastroenterology

## 2019-07-28 ENCOUNTER — Other Ambulatory Visit: Payer: Self-pay | Admitting: Internal Medicine

## 2019-07-28 DIAGNOSIS — J309 Allergic rhinitis, unspecified: Secondary | ICD-10-CM

## 2019-08-14 ENCOUNTER — Other Ambulatory Visit: Payer: Medicare (Managed Care)

## 2019-09-22 ENCOUNTER — Ambulatory Visit
Admission: RE | Admit: 2019-09-22 | Discharge: 2019-09-22 | Disposition: A | Discharge status: Home / Self Care | Payer: Medicare (Managed Care) | Source: Ambulatory Visit | Attending: Internal Medicine | Admitting: Internal Medicine

## 2019-09-22 DIAGNOSIS — J309 Allergic rhinitis, unspecified: Secondary | ICD-10-CM

## 2020-01-08 ENCOUNTER — Ambulatory Visit
Admission: RE | Admit: 2020-01-08 | Discharge: 2020-01-08 | Disposition: A | Discharge status: Home / Self Care | Payer: Medicare (Managed Care) | Source: Ambulatory Visit | Attending: Family Medicine | Admitting: Family Medicine

## 2020-01-08 ENCOUNTER — Other Ambulatory Visit: Payer: Self-pay | Admitting: Family Medicine

## 2020-01-08 DIAGNOSIS — R52 Pain, unspecified: Secondary | ICD-10-CM

## 2020-01-12 ENCOUNTER — Other Ambulatory Visit: Payer: Self-pay | Admitting: Family Medicine

## 2020-01-12 DIAGNOSIS — Z1231 Encounter for screening mammogram for malignant neoplasm of breast: Secondary | ICD-10-CM

## 2020-02-03 ENCOUNTER — Ambulatory Visit: Payer: Medicare (Managed Care)

## 2020-03-04 ENCOUNTER — Other Ambulatory Visit: Payer: Self-pay

## 2020-03-04 ENCOUNTER — Ambulatory Visit
Admission: RE | Admit: 2020-03-04 | Discharge: 2020-03-04 | Disposition: A | Discharge status: Home / Self Care | Payer: Medicare (Managed Care) | Source: Ambulatory Visit | Attending: Family Medicine | Admitting: Family Medicine

## 2020-03-04 DIAGNOSIS — Z1231 Encounter for screening mammogram for malignant neoplasm of breast: Secondary | ICD-10-CM

## 2020-04-08 ENCOUNTER — Telehealth: Payer: Self-pay

## 2020-04-08 NOTE — Telephone Encounter (Signed)
Tried to contact pt to follow up on homebound COVID vaccine referral. Left vm for pt to call us back at [phone] in order to complete pre-assesment and schedule vaccine.

## 2020-04-13 ENCOUNTER — Telehealth: Payer: Self-pay

## 2020-05-03 NOTE — Telephone Encounter (Signed)
04/13/2020 Pt left voice message on the [facility] home bound hotline requesting a booster. Contacted pt who states she now lives in [place] and has received the booster.  Dan Humphreys, BSN-RN-CCM

## 2020-07-13 ENCOUNTER — Other Ambulatory Visit: Payer: Self-pay | Admitting: Vascular Surgery

## 2020-07-13 DIAGNOSIS — Z1231 Encounter for screening mammogram for malignant neoplasm of breast: Secondary | ICD-10-CM

## 2020-09-01 ENCOUNTER — Ambulatory Visit
Admission: RE | Admit: 2020-09-01 | Discharge: 2020-09-01 | Disposition: A | Discharge status: Home / Self Care | Payer: Medicare (Managed Care) | Source: Ambulatory Visit | Attending: Family Medicine | Admitting: Family Medicine

## 2020-09-01 ENCOUNTER — Other Ambulatory Visit: Payer: Self-pay | Admitting: Family Medicine

## 2020-09-01 ENCOUNTER — Ambulatory Visit: Payer: Medicare (Managed Care) | Admitting: Gastroenterology

## 2020-09-01 DIAGNOSIS — W19XXXA Unspecified fall, initial encounter: Secondary | ICD-10-CM

## 2020-09-01 DIAGNOSIS — T1490XA Injury, unspecified, initial encounter: Secondary | ICD-10-CM

## 2020-10-12 ENCOUNTER — Ambulatory Visit (INDEPENDENT_AMBULATORY_CARE_PROVIDER_SITE_OTHER): Payer: Medicare (Managed Care) | Admitting: Gastroenterology

## 2020-10-12 ENCOUNTER — Encounter: Payer: Self-pay | Admitting: Gastroenterology

## 2020-10-12 VITALS — BP 130/70 | HR 93 | Ht 61.0 in | Wt 160.0 lb

## 2020-10-12 DIAGNOSIS — Z86010 Personal history of colonic polyps: Secondary | ICD-10-CM | POA: Diagnosis not present

## 2020-10-12 DIAGNOSIS — R0989 Other specified symptoms and signs involving the circulatory and respiratory systems: Secondary | ICD-10-CM

## 2020-10-12 DIAGNOSIS — K219 Gastro-esophageal reflux disease without esophagitis: Secondary | ICD-10-CM | POA: Diagnosis not present

## 2020-10-12 DIAGNOSIS — R198 Other specified symptoms and signs involving the digestive system and abdomen: Secondary | ICD-10-CM

## 2020-10-12 MED ORDER — GOLYTELY 236 G PO SOLR: 4000.0000 mL | Freq: Once | ORAL | 0 refills | Status: AC

## 2020-10-12 NOTE — Patient Instructions (Addendum)
If you are age 71 or older, your body mass index should be between 23-30. Your Body mass index is 30.23 kg/m. If this is out of the aforementioned range listed, please consider follow up with your Primary Care Provider.  If you are age 59 or younger, your body mass index should be between 19-25. Your Body mass index is 30.23 kg/m. If this is out of the aformentioned range listed, please consider follow up with your Primary Care Provider.   __________________________________________________________  The [facility] providers would like to encourage you to use [facility] to communicate with providers for non-urgent requests or questions.  Due to long hold times on the telephone, sending your provider a message by [facility] may be a faster and more efficient way to get a response.  Please allow 48 business hours for a response.  Please remember that this is for non-urgent requests.   You have been scheduled for an endoscopy and colonoscopy. Please follow the written instructions given to you at your visit today. Please pick up your prep supplies at the pharmacy within the next 1-3 days. If you use inhalers (even only as needed), please bring them with you on the day of your procedure.   [facility] will contact you with more information on the procedure instructions  It was a pleasure to see you today!  Thank you for trusting me with your gastrointestinal care!      It was a pleasure to see you today!  Thank you for trusting me with your gastrointestinal care!

## 2020-10-12 NOTE — Progress Notes (Signed)
[facility] Progress Note  Chief Complaint: Dysphagia  Subjective  History: I saw Danielle Clay for consultation at the request of the pace program in November 2020, with a history of chronic constipation, history detailed in that office note. Surveillance colonoscopy done on 07/15/2019 for history of polyps, perhaps 5 years prior while she was living in [place].  Those records were not available to [place]. 12 adenomatous polyps removed during my exam, one of them 15 mm in the cecum. 1 year surveillance exam was recommended.  She also has a history of chronic allergic symptoms with postnasal drip causing laryngeal discomfort and mucus production.  PCP notes indicate prior ENT evaluation.  Danielle Clay was referred back from the pace program for concern of worsening GERD lately and dysphagia or globus sensation.  As before, her history is somewhat limited by her recall, but she seems to have had intermittent regurgitation or pyrosis for years and has been on long-term PPI.  For several months she has had feeling of pills or food "stuck" in the neck, but they finally passed.  She has not had to cough or bring anything back up.  Liquids passed without difficulty.  Out of concern the symptoms may be reflux related, her Protonix was recently increased to twice daily.  She cannot say whether that has improved the symptoms.  She continues to have postnasal drip and mucus production and she has to use her inhaler sometimes. Chronic constipation continues in her treatment regimen as outlined below. ROS: Cardiovascular:  no chest pain Respiratory: no dyspnea Intermittent insomnia Back pain Remainder of systems negative except as above The patient's Past Medical, Family and Social History were reviewed and are on file in the EMR.  Objective:  Med list reviewed  Current Outpatient Medications:    Acetaminophen 500 MG capsule, Take 2 capsules by mouth 2 (two) times daily., Disp: , Rfl:    amLODipine  (NORVASC) 10 MG tablet, Take 10 mg by mouth daily., Disp: , Rfl:    aspirin EC 81 MG tablet, Take 81 mg by mouth daily., Disp: , Rfl:    atorvastatin (LIPITOR) 20 MG tablet, Take 20 mg by mouth daily., Disp: , Rfl:    budesonide-formoterol (SYMBICORT) 160-4.5 MCG/ACT inhaler, Inhale 2 puffs into the lungs 2 (two) times daily., Disp: , Rfl:    DULoxetine (CYMBALTA) 60 MG capsule, Take 1 capsule (60 mg total) by mouth daily., Disp: 30 capsule, Rfl: 1   guaiFENesin (MUCINEX) 600 MG 12 hr tablet, Take 600 mg by mouth 2 (two) times daily as needed., Disp: , Rfl:    LOSARTAN POTASSIUM PO, Take by mouth. Take 25mg  daily, Disp: , Rfl:    montelukast (SINGULAIR) 10 MG tablet, Take 10 mg by mouth daily., Disp: , Rfl:    pantoprazole (PROTONIX) 40 MG tablet, Take 40 mg by mouth daily., Disp: , Rfl:    pregabalin (LYRICA) 50 MG capsule, Take 50 mg by mouth 2 (two) times daily., Disp: , Rfl:    Suvorexant (BELSOMRA) 20 MG TABS, Take 20 mg by mouth at bedtime. , Disp: , Rfl:    traZODone (DESYREL) 50 MG tablet, Take 50 mg by mouth at bedtime., Disp: , Rfl:    Vitamin D, Cholecalciferol, 25 MCG (1000 UT) TABS, Take 1 tablet by mouth daily., Disp: , Rfl:    Vital signs in last 24 hrs: Vitals:   10/12/20 1403  BP: 130/70  Pulse: 93   Wt Readings from Last 3 Encounters:  10/12/20 160  lb (72.6 kg)  07/15/19 153 lb 9.6 oz (69.7 kg)  07/01/19 153 lb 9.6 oz (69.7 kg)    Physical Exam  Not acutely ill-appearing, normal vocal quality HEENT: sclera anicteric, oral mucosa moist without lesions Neck: supple, no thyromegaly, JVD or lymphadenopathy Cardiac: RRR without murmurs, S1S2 heard, no peripheral edema Pulm: clear to auscultation bilaterally, normal RR and effort noted Abdomen: soft, mild epigastric tenderness, with active bowel sounds. No guarding or palpable hepatosplenomegaly. Skin; warm and dry, no jaundice or rash  Labs:   ___________________________________________ Radiologic  studies:   ____________________________________________ Other:   _____________________________________________ Assessment & Plan  Assessment: Encounter Diagnoses  Name Primary?   Gastroesophageal reflux disease, unspecified whether esophagitis present Yes   Globus sensation    Personal history of colonic polyps    Longstanding GERD, symptoms sound generally under control.  Occasional breakthrough pyrosis or epigastric burning. Globus sensation, often difficult to say if related to reflux, allergies/postnasal drip and laryngeal irritation or both.  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 She was agreeable after discussion of procedure and risks.  The benefits and risks of the planned procedure were described in detail with the patient or (when appropriate) their health care proxy.  Risks were outlined as including, but not limited to, bleeding, infection, perforation, adverse medication reaction leading to cardiac or pulmonary decompensation, pancreatitis (if ERCP).  The limitation of incomplete mucosal visualization was also discussed.  No guarantees or warranties were given.  4 L split GoLytely prep due to chronic constipation.  If upper endoscopy unrevealing, will recommend decreasing PPI to once daily  30 minutes were spent on this encounter (including chart review, history/exam, counseling/coordination of care, and documentation) > 50% of that time was spent on counseling and coordination of care.  Topics discussed included: See above.  Nelida Meuse III

## 2020-11-01 ENCOUNTER — Encounter: Payer: Self-pay | Admitting: Gastroenterology

## 2020-11-12 ENCOUNTER — Encounter: Payer: Medicare (Managed Care) | Admitting: Gastroenterology

## 2020-12-09 ENCOUNTER — Encounter: Payer: Medicare (Managed Care) | Admitting: Gastroenterology

## 2021-01-18 ENCOUNTER — Telehealth: Payer: Self-pay | Admitting: *Deleted

## 2021-01-18 NOTE — Telephone Encounter (Signed)
Pt didn't reschedule PV. Colonoscopy cancelled and letter sent

## 2021-01-18 NOTE — Telephone Encounter (Signed)
Danielle Clay at [facility] states PV is supposed to be over the phone  Attempted pt at 1430 pm, 1435 pm. 1437 pm and lastly 1440 pm- The number [phone] rings a half a time then states VM box is not set up and unable to Danielle Clay at [place] on her Voice mail at [facility] --informed her unable to reach pt by phone and what is happening- asked her to [facility] and RS Danielle Clay before 5 pm today to avoid her 11-3 ECL being canceled   [name] PV

## 2021-02-03 ENCOUNTER — Encounter: Payer: Medicare (Managed Care) | Admitting: Gastroenterology

## 2021-03-07 ENCOUNTER — Ambulatory Visit: Payer: Medicare (Managed Care)

## 2021-03-15 IMAGING — CR DG HAND COMPLETE 3+V*R*
3 series · 3 of 3 positions shown · non-contrast
Comparison: October 06, 2018

CLINICAL DATA: Fall, pain

EXAM:
RIGHT HAND - COMPLETE 3+ VIEW

[x hand pa right]
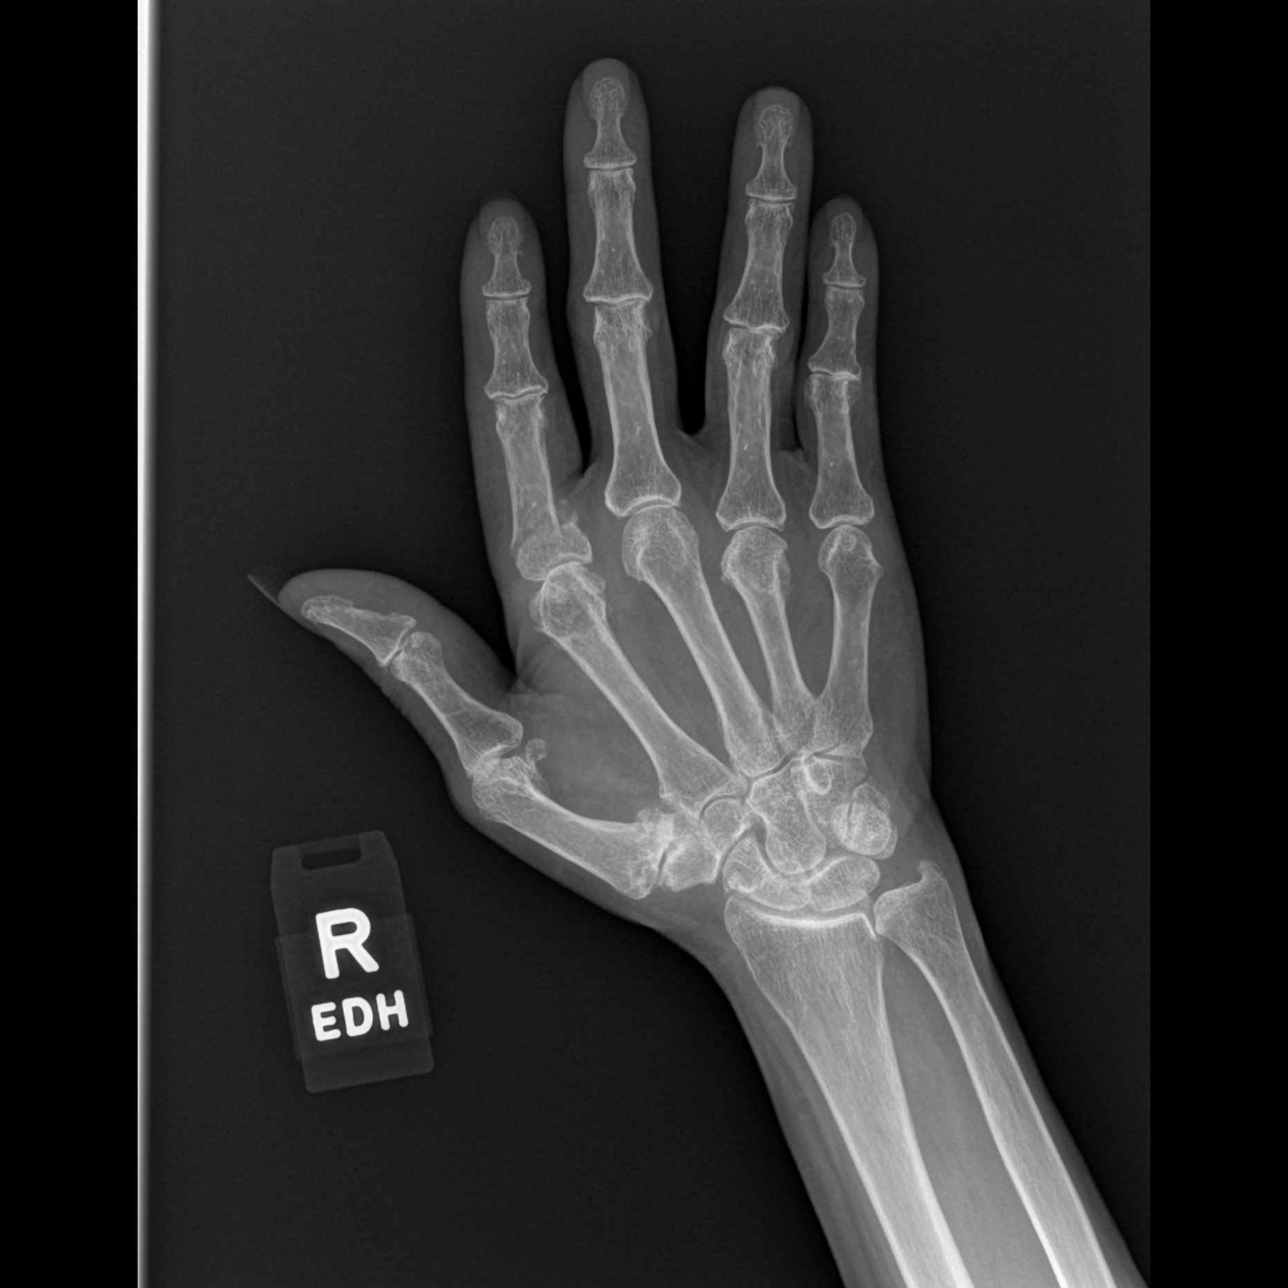

[x hand oblique right]
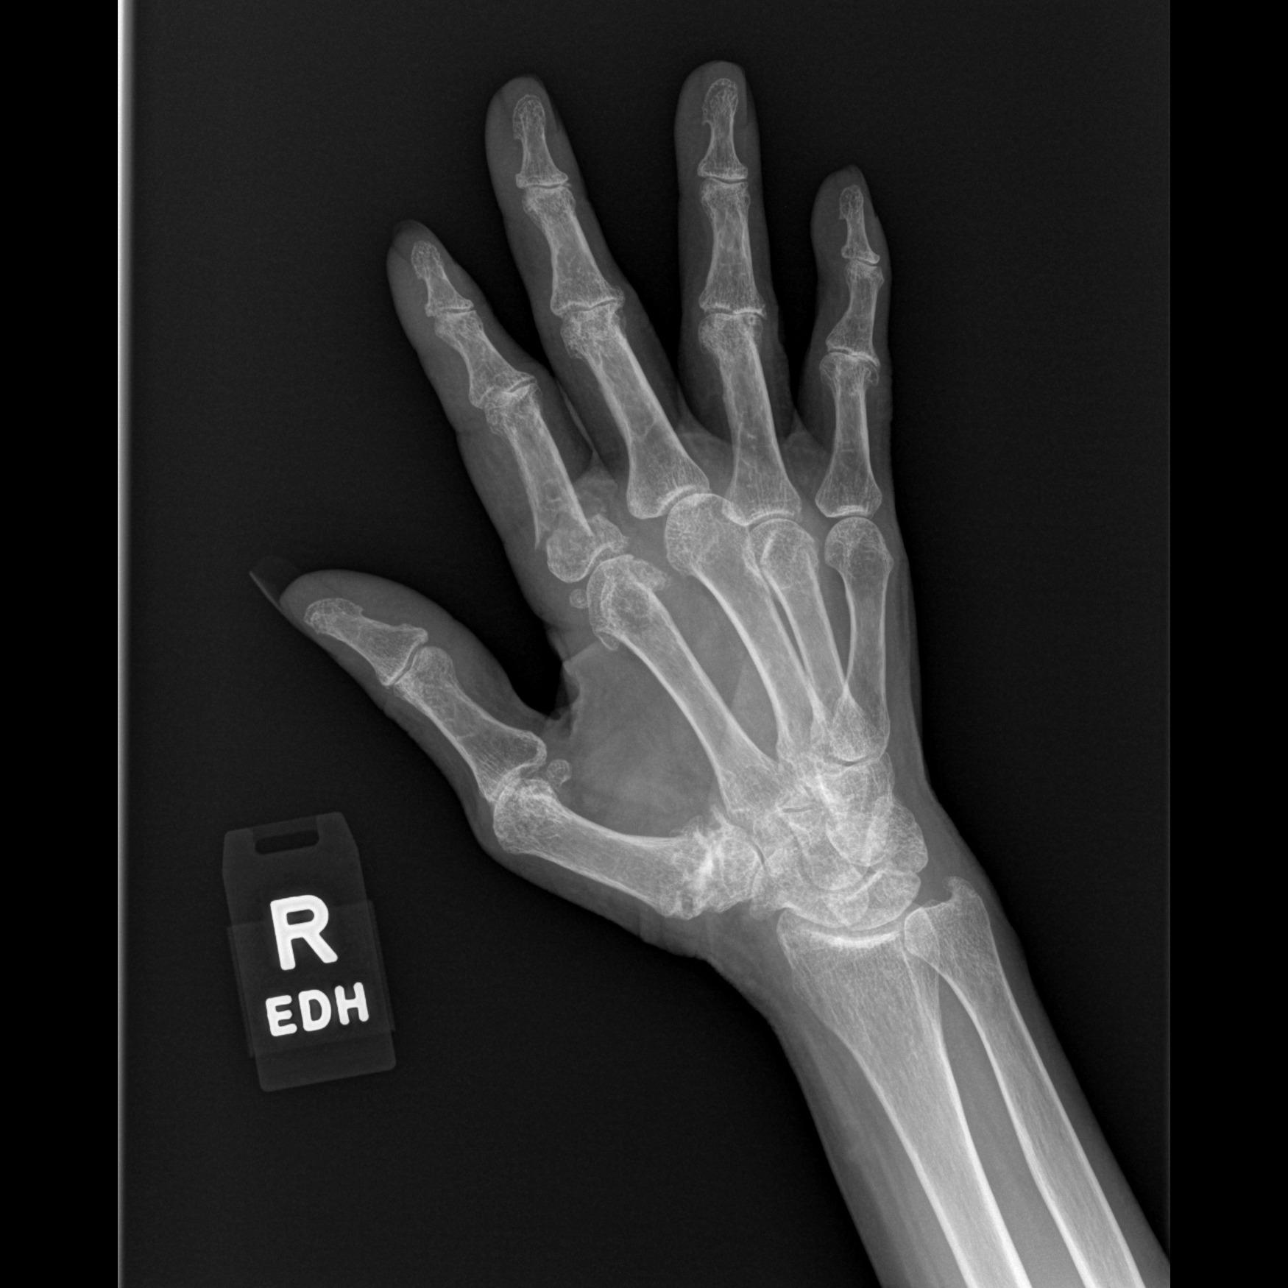

[x hand lat right]
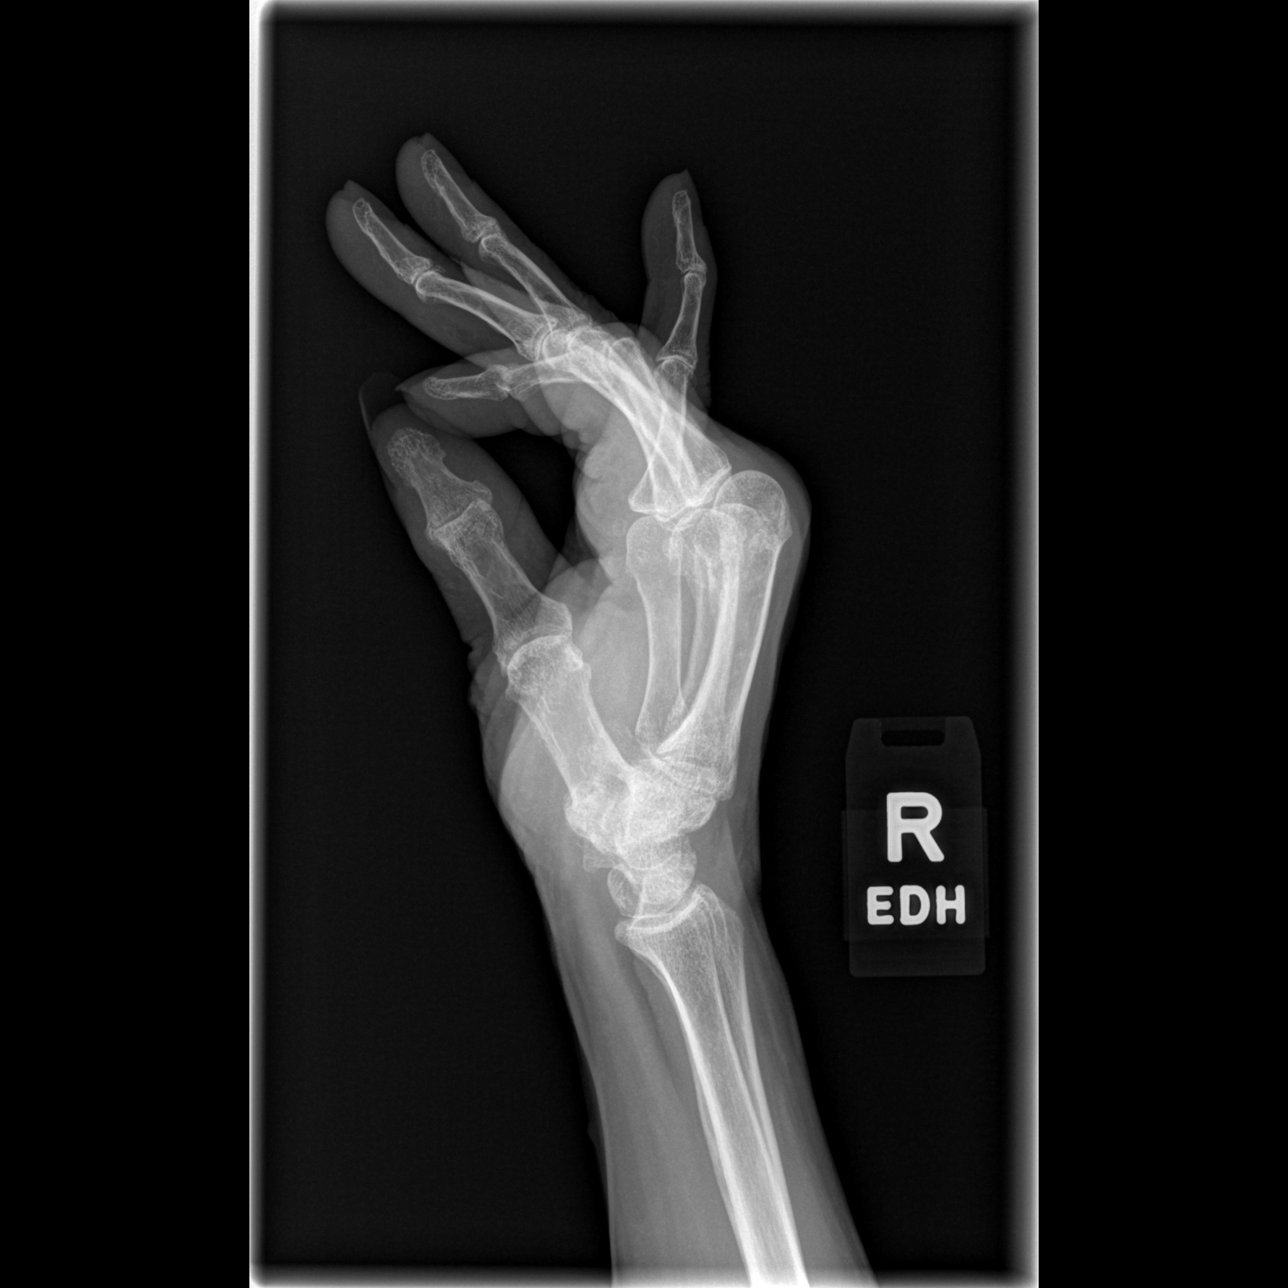

[3 of 3 positions shown; findings below may reference images not displayed]

FINDINGS: There is an acute fracture through the base of the second proximal
phalanx shaft with mild displacement.

Decreased osseous mineralization. Multifocal osteoarthritis with
involvement of proximal and distal interphalangeal joints and the
first carpometacarpal joint.
IMPRESSION: Acute fracture through the base of the shaft of the second proximal
phalanx with mild displacement.

## 2021-04-11 ENCOUNTER — Other Ambulatory Visit: Payer: Self-pay

## 2021-04-11 DIAGNOSIS — I739 Peripheral vascular disease, unspecified: Secondary | ICD-10-CM

## 2021-04-22 ENCOUNTER — Ambulatory Visit: Payer: Medicare (Managed Care)

## 2021-04-25 ENCOUNTER — Other Ambulatory Visit: Payer: Self-pay

## 2021-04-25 ENCOUNTER — Ambulatory Visit (AMBULATORY_SURGERY_CENTER): Payer: Medicare (Managed Care) | Admitting: *Deleted

## 2021-04-25 VITALS — Ht 61.0 in | Wt 160.0 lb

## 2021-04-25 DIAGNOSIS — K219 Gastro-esophageal reflux disease without esophagitis: Secondary | ICD-10-CM

## 2021-04-25 DIAGNOSIS — Z86010 Personal history of colonic polyps: Secondary | ICD-10-CM

## 2021-04-25 NOTE — Progress Notes (Signed)
Patient's pre-visit was done today over the phone with the patient. Name,DOB and address verified. Patient denies any allergies to Eggs and Soy. Patient denies any problems with anesthesia/sedation. Patient is not taking any diet pills or blood thinners. No home Oxygen. Golytely rx and dulcolax faced to [facility].  Prep instructions sent to pt's Mail-pt aware. Patient understands to call us back with any questions or concerns. Patient is aware of our care-partner policy and [id number] safety protocol. Patient denies any medical chart hx changes since last GI office visit.   The patient is COVID-19 vaccinated.

## 2021-04-27 ENCOUNTER — Ambulatory Visit (HOSPITAL_COMMUNITY)
Admission: RE | Admit: 2021-04-27 | Discharge: 2021-04-27 | Disposition: A | Discharge status: Home / Self Care | Payer: Medicare (Managed Care) | Source: Ambulatory Visit | Attending: Vascular Surgery | Admitting: Vascular Surgery

## 2021-04-27 ENCOUNTER — Ambulatory Visit (INDEPENDENT_AMBULATORY_CARE_PROVIDER_SITE_OTHER): Payer: Medicare (Managed Care) | Admitting: Vascular Surgery

## 2021-04-27 ENCOUNTER — Other Ambulatory Visit: Payer: Self-pay

## 2021-04-27 ENCOUNTER — Encounter: Payer: Self-pay | Admitting: Vascular Surgery

## 2021-04-27 VITALS — BP 116/77 | HR 100 | Temp 98.2°F | Resp 20 | Ht 61.0 in | Wt 155.0 lb

## 2021-04-27 DIAGNOSIS — I739 Peripheral vascular disease, unspecified: Secondary | ICD-10-CM

## 2021-04-27 MED ORDER — BISACODYL EC 5 MG PO TBEC: 5.0000 mg | DELAYED_RELEASE_TABLET | Freq: Once | ORAL | 0 refills | Status: AC

## 2021-04-27 MED ORDER — PEG 3350-KCL-NA BICARB-NACL 420 G PO SOLR: 4000.0000 mL | Freq: Once | ORAL | 0 refills | Status: AC

## 2021-04-27 NOTE — Addendum Note (Signed)
Addended by: Levonne Spiller on: 04/27/2021 06:50 AM   Modules accepted: Orders

## 2021-04-27 NOTE — Progress Notes (Signed)
Patient ID: Danielle Clay, female   DOB: 06-18-1950, 71 y.o.   MRN: [id number]  Reason for Consult: New Patient (Initial Visit)   Referred by Janifer Adie, MD  Subjective:     HPI:  Danielle Clay is a 71 y.o. female with bilateral lower extremity pain.  States that her pain is pretty constant.  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  She is never had a lower extremity interventions.  She recently had low-grade trauma of her bilateral ankles from ankle bracelets that she does have some skin discoloration on the anterior aspects of both ankles.  She also has some darkened discoloration of her bilateral toes.  She does not have a tissue loss or ulceration.  She denies any history of stroke, TIA or amaurosis and has no personal or family history of aneurysm disease.  There is a strong family history of coronary and peripheral arterial disease.  She does take an aspirin and also takes a statin medication.  She is not on any blood thinners.  She is a current every day smoker approximately 1 pack/day.  She states that she cannot walk very far given the leg pain in her legs "give out" after very short distance walking.  Past Medical History:  Diagnosis Date   Allergy    seasonal   Arthritis    Bronchial asthma    Colon polyps    Constipation    COPD (chronic obstructive pulmonary disease) (HCC)    Early cataracts, bilateral    Fibromyalgia    Fracture    right index finger proximal phalanx fracture   GERD (gastroesophageal reflux disease)    Hypertension    Migraine    Osteoporosis    takes vitamins for this.   Wears glasses    Family History  Problem Relation Age of Onset   Diabetes Mother    Anemia Mother    HIV Sister    Heart Problems Brother    Asthma Brother    HIV Brother    HIV Brother    Esophageal cancer Maternal Uncle        can't remember when   Colon cancer Neg Hx     Colon polyps Neg Hx    Stomach cancer Neg Hx    Rectal cancer Neg Hx    Past Surgical History:  Procedure Laterality Date   BREAST EXCISIONAL BIOPSY Right    CHOLECYSTECTOMY     CLOSED REDUCTION FINGER WITH PERCUTANEOUS PINNING Right 03/05/2019   Procedure: CLOSED REDUCTION FINGER WITH PERCUTANEOUS PINNING AND OPEN REDUCTION AND INTERNAL FIXTATION RIGHT INDEX FINGER;  Surgeon: Iran Planas, MD;  Location: [place];  Service: Orthopedics;  Laterality: Right;  REGINAL WITH IV SEDATION   COLONOSCOPY     6 yrs ago with polyps   COLONOSCOPY W/ BIOPSIES AND POLYPECTOMY     CYST REMOVAL HAND     Under left breast   GALLBLADDER SURGERY     POLYPECTOMY     TONSILLECTOMY AND ADENOIDECTOMY     TUBAL LIGATION     WISDOM TOOTH EXTRACTION      Short Social History:  Social History   Tobacco Use   Smoking status: Every Day    Packs/day: 1.00    Types: Cigarettes   Smokeless tobacco: Never  Substance Use Topics   Alcohol use: Yes    Comment: few times a week    Allergies  Allergen  Reactions   Penicillins Swelling    Did it involve swelling of the face/tongue/throat, SOB, or low BP? Yes Did it involve sudden or severe rash/hives, skin peeling, or any reaction on the inside of your mouth or nose? Yes Did you need to seek medical attention at a hospital or doctor's office? Yes When did it last happen?      1970 If all above answers are NO, may proceed with cephalosporin use.    Percocet [Oxycodone-Acetaminophen] Itching    Current Outpatient Medications  Medication Sig Dispense Refill   Acetaminophen 500 MG capsule Take 2 capsules by mouth 2 (two) times daily.     amLODipine (NORVASC) 10 MG tablet Take 10 mg by mouth daily.     aspirin EC 81 MG tablet Take 81 mg by mouth daily.     atorvastatin (LIPITOR) 20 MG tablet Take 20 mg by mouth daily.     bisacodyl 5 MG EC tablet Take 1 tablet (5 mg total) by mouth once for 1 dose. Take as directed for colonoscopy prep 4 tablet 0    budesonide-formoterol (SYMBICORT) 160-4.5 MCG/ACT inhaler Inhale 2 puffs into the lungs 2 (two) times daily.     DULoxetine (CYMBALTA) 60 MG capsule Take 1 capsule (60 mg total) by mouth daily. 30 capsule 1   guaiFENesin (MUCINEX) 600 MG 12 hr tablet Take 600 mg by mouth 2 (two) times daily as needed.     LOSARTAN POTASSIUM PO Take by mouth. Take 25mg  daily     montelukast (SINGULAIR) 10 MG tablet Take 10 mg by mouth daily.     pantoprazole (PROTONIX) 40 MG tablet Take 40 mg by mouth daily.     polyethylene glycol-electrolytes (NULYTELY) 420 g solution Take 4,000 mLs by mouth once for 1 dose. 4000 mL 0   pregabalin (LYRICA) 50 MG capsule Take 50 mg by mouth 2 (two) times daily.     Suvorexant (BELSOMRA) 20 MG TABS Take 20 mg by mouth at bedtime.      traZODone (DESYREL) 50 MG tablet Take 50 mg by mouth at bedtime.     Vitamin D, Cholecalciferol, 25 MCG (1000 UT) TABS Take 1 tablet by mouth daily.     No current facility-administered medications for this visit.    Review of Systems  Constitutional:  Constitutional negative. HENT: HENT negative.  Eyes: Eyes negative.  Respiratory: Positive for shortness of breath.  Cardiovascular: Positive for claudication and dyspnea with exertion.  GI: Gastrointestinal negative.  Musculoskeletal: Positive for back pain, gait problem, leg pain and joint pain.  Hematologic: Hematologic/lymphatic negative.  Psychiatric: Psychiatric negative.       Objective:  Objective   Vitals:   04/27/21 1143  BP: 116/77  Pulse: 100  Resp: 20  Temp: 98.2 F (36.8 C)  SpO2: 90%  Weight: 155 lb (70.3 kg)  Height: 5\' 1"  (1.549 m)   Body mass index is 29.29 kg/m.  Physical Exam HENT:     Head: Normocephalic.     Nose:     Comments: Wearing a mask Eyes:     Pupils: Pupils are equal, round, and reactive to light.  Neck:     Vascular: No carotid bruit.  Cardiovascular:     Rate and Rhythm: Normal rate.     Pulses:          Femoral pulses are 0 on the  right side and 0 on the left side.    Heart sounds: Normal heart sounds.  Pulmonary:  Effort: Pulmonary effort is normal.  Abdominal:     General: Abdomen is flat.     Palpations: Abdomen is soft. There is no mass.  Musculoskeletal:     Right lower leg: No edema.     Left lower leg: No edema.  Skin:    Capillary Refill: Delayed bilaterally    Comments: Dark discoloration bilateral toes  Neurological:     General: No focal deficit present.     Mental Status: She is alert.  Psychiatric:        Mood and Affect: Mood normal.        Behavior: Behavior normal.        Thought Content: Thought content normal.    Data: ABI Findings:  +---------+------------------+-----+----------+--------+   Right     Rt Pressure (mmHg) Index Waveform   Comment    +---------+------------------+-----+----------+--------+   Brachial  139                                            +---------+------------------+-----+----------+--------+   PTA       70                 0.50  monophasic            +---------+------------------+-----+----------+--------+   DP        61                 0.44  biphasic              +---------+------------------+-----+----------+--------+   Great Toe 13                 0.09                        +---------+------------------+-----+----------+--------+   +---------+------------------+-----+----------+-------+   Left      Lt Pressure (mmHg) Index Waveform   Comment   +---------+------------------+-----+----------+-------+   Brachial  134                                           +---------+------------------+-----+----------+-------+   PTA       75                 0.54  monophasic           +---------+------------------+-----+----------+-------+   DP        68                 0.49  monophasic           +---------+------------------+-----+----------+-------+   Great Toe 29                 0.21                       +---------+------------------+-----+----------+-------+    +-------+-----------+-----------+------------+------------+   ABI/TBI Today's ABI Today's TBI Previous ABI Previous TBI   +-------+-----------+-----------+------------+------------+   Right   0.50        0.09                                    +-------+-----------+-----------+------------+------------+   Left    0.54  0.21                                    +-------+-----------+-----------+------------+------------+    Summary:  Right: Resting right ankle-brachial index indicates moderate right lower  extremity arterial disease. The right toe-brachial index is abnormal.   Left: Resting left ankle-brachial index indicates moderate left lower  extremity arterial disease. The left toe-brachial index is abnormal.       Assessment/Plan:     71 year old female with bilateral moderate ABIs current every day smoker.  Monophasic waveforms bilaterally at the ankles I cannot reliably feel femoral pulses today suggesting inflow disease.  I discussed with her the need for urgent smoking cessation at least cutting back from 1 pack a day to less than 1 pack/day.  She will continue aspirin and a statin.  We will get CT scanning with contrast the aorta bilateral lower extremities to evaluate for inflow disease.  From there we can discuss treatment.  She needs to continue walking protect her feet is much as possible we will follow her up after the CT scan.    Waynetta Sandy MD [facility]

## 2021-05-09 ENCOUNTER — Encounter: Payer: Self-pay | Admitting: Gastroenterology

## 2021-05-09 ENCOUNTER — Other Ambulatory Visit: Payer: Self-pay

## 2021-05-09 ENCOUNTER — Ambulatory Visit (AMBULATORY_SURGERY_CENTER): Payer: Medicare (Managed Care) | Admitting: Gastroenterology

## 2021-05-09 DIAGNOSIS — K633 Ulcer of intestine: Secondary | ICD-10-CM

## 2021-05-09 DIAGNOSIS — Z86010 Personal history of colonic polyps: Secondary | ICD-10-CM | POA: Diagnosis not present

## 2021-05-09 DIAGNOSIS — D123 Benign neoplasm of transverse colon: Secondary | ICD-10-CM

## 2021-05-09 DIAGNOSIS — R070 Pain in throat: Secondary | ICD-10-CM

## 2021-05-09 DIAGNOSIS — K219 Gastro-esophageal reflux disease without esophagitis: Secondary | ICD-10-CM | POA: Diagnosis not present

## 2021-05-09 DIAGNOSIS — D122 Benign neoplasm of ascending colon: Secondary | ICD-10-CM

## 2021-05-09 DIAGNOSIS — K51914 Ulcerative colitis, unspecified with abscess: Secondary | ICD-10-CM | POA: Diagnosis not present

## 2021-05-09 DIAGNOSIS — K5289 Other specified noninfective gastroenteritis and colitis: Secondary | ICD-10-CM | POA: Diagnosis not present

## 2021-05-09 MED ORDER — SODIUM CHLORIDE 0.9 % IV SOLN: 500.0000 mL | Freq: Once | INTRAVENOUS | Status: DC

## 2021-05-09 NOTE — Op Note (Signed)
[facility] Patient Name: Danielle Clay Procedure Date: 05/09/2021 2:04 PM MRN: [id number] Endoscopist: Moultrie Loletha Carrow , MD Age: 71 Referring MD:  Date of Birth: [birth year] Gender: Female Account #: [id number] Procedure:                Colonoscopy Indications:              Surveillance: History of numerous (> 10) adenomas                            on last colonoscopy (< 3 yrs)                           12 tubular adenomas last colonoscopy April 2021 Medicines:                Monitored Anesthesia Care Procedure:                Pre-Anesthesia Assessment:                           - Prior to the procedure, a History and Physical                            was performed, and patient medications and                            allergies were reviewed. The patient's tolerance of                            previous anesthesia was also reviewed. The risks                            and benefits of the procedure and the sedation                            options and risks were discussed with the patient.                            All questions were answered, and informed consent                            was obtained. Prior Anticoagulants: The patient has                            taken no previous anticoagulant or antiplatelet                            agents. ASA Grade Assessment: III - A patient with                            severe systemic disease. After reviewing the risks                            and benefits, the patient was deemed in  satisfactory condition to undergo the procedure.                           After obtaining informed consent, the colonoscope                            was passed under direct vision. Throughout the                            procedure, the patient's blood pressure, pulse, and                            oxygen saturations were monitored continuously. The                            CF [id number] #[id number] was  introduced through the anus                            and advanced to the the cecum, identified by                            appendiceal orifice and ileocecal valve. The                            colonoscopy was somewhat difficult due to a                            redundant colon and significant looping. Successful                            completion of the procedure was aided by using                            manual pressure and straightening and shortening                            the scope to obtain bowel loop reduction. The                            patient tolerated the procedure well. The quality                            of the bowel preparation was good. The ileocecal                            valve, appendiceal orifice, and rectum were                            photographed. The bowel preparation used was                            GoLYTELY. Scope In: 2:15:04 PM Scope Out: 2:40:36 PM Scope Withdrawal Time: 0 hours 21 minutes 53 seconds  Total Procedure Duration: 0 hours 25  minutes 32 seconds  Findings:                 The digital rectal exam findings include decreased                            sphincter tone.                           Repeat examination of right colon under NBI                            performed.                           A post polypectomy scar was found in the proximal                            ascending colon. Adjacent mucosal findings include                            nodularity (adjacent to and on scar). Biopsies were                            taken with a cold forceps for histology. Area                            (about 5cm distal) was tattooed with an injection                            of 0.5 mL of Spot (carbon black).                           A 5 mm polyp was found in the transverse colon. The                            polyp was semi-sessile. The polyp was removed with                            a cold snare. Resection and  retrieval were complete.                           Multiple diverticula were found in the left colon                            and right colon.                           Retroflexion in the rectum was not performed                            (difficulty retaining air). Careful anteflexion                            exam performed.  The exam was otherwise without abnormality. Complications:            No immediate complications. Estimated Blood Loss:     Estimated blood loss was minimal. Impression:               - Decreased sphincter tone found on digital rectal                            exam.                           - Post-polypectomy scar in the proximal ascending                            colon. Biopsied. Tattooed.                           - One 5 mm polyp in the transverse colon, removed                            with a cold snare. Resected and retrieved.                           - Diverticulosis in the left colon and in the right                            colon.                           - The examination was otherwise normal. Recommendation:           - Patient has a contact number available for                            emergencies. The signs and symptoms of potential                            delayed complications were discussed with the                            patient. Return to normal activities tomorrow.                            Written discharge instructions were provided to the                            patient.                           - Resume previous diet.                           - Continue present medications.                           - Await pathology results.                           -  Repeat colonoscopy is recommended for                            surveillance. The colonoscopy date will be                            determined after pathology results from today's                            exam become available for  review.                           - See the other procedure note for documentation of                            additional recommendations. Casilda Pickerill L. Loletha Carrow, MD 05/09/2021 2:57:56 PM This report has been signed electronically.

## 2021-05-09 NOTE — Patient Instructions (Addendum)
Handouts given on polyps and diverticulosis.  Begin taking your Protonix once a day.    YOU HAD AN ENDOSCOPIC PROCEDURE TODAY AT [facility]:   Refer to the procedure report that was given to you for any specific questions about what was found during the examination.  If the procedure report does not answer your questions, please call your gastroenterologist to clarify.  If you requested that your care partner not be given the details of your procedure findings, then the procedure report has been included in a sealed envelope for you to review at your convenience later.  YOU SHOULD EXPECT: Some feelings of bloating in the abdomen. Passage of more gas than usual.  Walking can help get rid of the air that was put into your GI tract during the procedure and reduce the bloating. If you had a lower endoscopy (such as a colonoscopy or flexible sigmoidoscopy) you may notice spotting of blood in your stool or on the toilet paper. If you underwent a bowel prep for your procedure, you may not have a normal bowel movement for a few days.  Please Note:  You might notice some irritation and congestion in your nose or some drainage.  This is from the oxygen used during your procedure.  There is no need for concern and it should clear up in a day or so.  SYMPTOMS TO REPORT IMMEDIATELY:  Following lower endoscopy (colonoscopy or flexible sigmoidoscopy):  Excessive amounts of blood in the stool  Significant tenderness or worsening of abdominal pains  Swelling of the abdomen that is new, acute  Fever of 100F or higher  Following upper endoscopy (EGD)  Vomiting of blood or coffee ground material  New chest pain or pain under the shoulder blades  Painful or persistently difficult swallowing  New shortness of breath  Fever of 100F or higher  Black, tarry-looking stools  For urgent or emergent issues, a gastroenterologist can be reached at any hour by calling [phone]. Do not use MyChart  messaging for urgent concerns.    DIET:  We do recommend a small meal at first, but then you may proceed to your regular diet.  Drink plenty of fluids but you should avoid alcoholic beverages for 24 hours.  ACTIVITY:  You should plan to take it easy for the rest of today and you should NOT DRIVE or use heavy machinery until tomorrow (because of the sedation medicines used during the test).    FOLLOW UP: Our staff will call the number listed on your records 48-72 hours following your procedure to check on you and address any questions or concerns that you may have regarding the information given to you following your procedure. If we do not reach you, we will leave a message.  We will attempt to reach you two times.  During this call, we will ask if you have developed any symptoms of COVID 19. If you develop any symptoms (ie: fever, flu-like symptoms, shortness of breath, cough etc.) before then, please call [phone].  If you test positive for Covid 19 in the 2 weeks post procedure, please call and report this information to [place].    If any biopsies were taken you will be contacted by phone or by letter within the next 1-3 weeks.  Please call us at [phone] if you have not heard about the biopsies in 3 weeks.    SIGNATURES/CONFIDENTIALITY: You and/or your care partner have signed paperwork which will be entered into your electronic  medical record.  These signatures attest to the fact that that the information above on your After Visit Summary has been reviewed and is understood.  Full responsibility of the confidentiality of this discharge information lies with you and/or your care-partner.

## 2021-05-09 NOTE — Progress Notes (Signed)
Pt's states no medical or surgical changes since previsit or office visit. 

## 2021-05-09 NOTE — Progress Notes (Signed)
To pacu, VSS. Report to RN.tb 

## 2021-05-09 NOTE — Progress Notes (Signed)
History and Physical:  This patient presents for endoscopic testing for: Encounter Diagnoses  Name Primary?   Personal history of colonic polyps Yes   Gastroesophageal reflux disease, unspecified whether esophagitis present     Last office visit July 2022 (note reviewed - clinical details are in it) Patient reports same throat discomfort and mucus production. Chronic constipation and denies rectal bleeding  ROS: Patient denies chest pain or shortness of breath   Past Medical History: Past Medical History:  Diagnosis Date   Allergy    seasonal   Arthritis    Bronchial asthma    Colon polyps    Constipation    COPD (chronic obstructive pulmonary disease) (HCC)    Early cataracts, bilateral    Fibromyalgia    Fracture    right index finger proximal phalanx fracture   GERD (gastroesophageal reflux disease)    Hypertension    Migraine    Osteoporosis    takes vitamins for this.   Wears glasses      Past Surgical History: Past Surgical History:  Procedure Laterality Date   BREAST EXCISIONAL BIOPSY Right    CHOLECYSTECTOMY     CLOSED REDUCTION FINGER WITH PERCUTANEOUS PINNING Right 03/05/2019   Procedure: CLOSED REDUCTION FINGER WITH PERCUTANEOUS PINNING AND OPEN REDUCTION AND INTERNAL FIXTATION RIGHT INDEX FINGER;  Surgeon: Iran Planas, MD;  Location: [facility];  Service: Orthopedics;  Laterality: Right;  REGINAL WITH IV SEDATION   COLONOSCOPY     6 yrs ago with polyps   COLONOSCOPY W/ BIOPSIES AND POLYPECTOMY     CYST REMOVAL HAND     Under left breast   GALLBLADDER SURGERY     POLYPECTOMY     TONSILLECTOMY AND ADENOIDECTOMY     TUBAL LIGATION     WISDOM TOOTH EXTRACTION      Allergies: Allergies  Allergen Reactions   Penicillins Swelling    Did it involve swelling of the face/tongue/throat, SOB, or low BP? Yes Did it involve sudden or severe rash/hives, skin peeling, or any reaction on the inside of your mouth or nose? Yes Did you need to seek medical  attention at a hospital or doctor's office? Yes When did it last happen?      1970 If all above answers are NO, may proceed with cephalosporin use.    Percocet [Oxycodone-Acetaminophen] Itching    Outpatient Meds: Current Outpatient Medications  Medication Sig Dispense Refill   Acetaminophen 500 MG capsule Take 2 capsules by mouth 2 (two) times daily.     amLODipine (NORVASC) 10 MG tablet Take 10 mg by mouth daily.     aspirin EC 81 MG tablet Take 81 mg by mouth daily.     atorvastatin (LIPITOR) 20 MG tablet Take 20 mg by mouth daily.     budesonide-formoterol (SYMBICORT) 160-4.5 MCG/ACT inhaler Inhale 2 puffs into the lungs 2 (two) times daily.     DULoxetine (CYMBALTA) 60 MG capsule Take 1 capsule (60 mg total) by mouth daily. 30 capsule 1   guaiFENesin (MUCINEX) 600 MG 12 hr tablet Take 600 mg by mouth 2 (two) times daily as needed.     LOSARTAN POTASSIUM PO Take by mouth. Take 25mg  daily     montelukast (SINGULAIR) 10 MG tablet Take 10 mg by mouth daily.     pantoprazole (PROTONIX) 40 MG tablet Take 40 mg by mouth daily.     pregabalin (LYRICA) 50 MG capsule Take 50 mg by mouth 2 (two) times daily.     Suvorexant (BELSOMRA)  20 MG TABS Take 20 mg by mouth at bedtime.      traZODone (DESYREL) 50 MG tablet Take 50 mg by mouth at bedtime.     Vitamin D, Cholecalciferol, 25 MCG (1000 UT) TABS Take 1 tablet by mouth daily.     Current Facility-Administered Medications  Medication Dose Route Frequency Provider Last Rate Last Admin   0.9 %  sodium chloride infusion  500 mL Intravenous Once Danis, Kirke Corin, MD          ___________________________________________________________________ Objective   Exam:  BP 116/72    Pulse (!) 106    Temp 98.4 F (36.9 C) (Temporal)    Ht 5\' 1"  (1.549 m)    Wt 160 lb (72.6 kg)    SpO2 92%    BMI 30.23 kg/m   CV: RRR without murmur, S1/S2 Resp: clear to auscultation bilaterally, normal RR and effort noted GI: soft, no tenderness, with active  bowel sounds.   Assessment: Encounter Diagnoses  Name Primary?   Personal history of colonic polyps Yes   Gastroesophageal reflux disease, unspecified whether esophagitis present      Plan: Colonoscopy EGD  The benefits and risks of the planned procedure were described in detail with the patient or (when appropriate) their health care proxy.  Risks were outlined as including, but not limited to, bleeding, infection, perforation, adverse medication reaction leading to cardiac or pulmonary decompensation, pancreatitis (if ERCP).  The limitation of incomplete mucosal visualization was also discussed.  No guarantees or warranties were given.    The patient is appropriate for an endoscopic procedure in the ambulatory setting.   - Wilfrid Lund, MD

## 2021-05-09 NOTE — Progress Notes (Signed)
Called to room to assist during endoscopic procedure.  Patient ID and intended procedure confirmed with present staff. Received instructions for my participation in the procedure from the performing physician.  

## 2021-05-09 NOTE — Op Note (Signed)
[facility] Patient Name: Danielle Clay Procedure Date: 05/09/2021 2:04 PM MRN: [id number] Endoscopist: Calion Loletha Carrow , MD Age: 71 Referring MD:  Date of Birth: 08-16-1950 Gender: Female Account #: [id number] Procedure:                Upper GI endoscopy Indications:              Esophageal reflux, Globus sensation (throat pain,                            mucus production). PPI was increased to twice daily                            last year due to the throat symptoms Medicines:                Monitored Anesthesia Care Procedure:                Pre-Anesthesia Assessment:                           - Prior to the procedure, a History and Physical                            was performed, and patient medications and                            allergies were reviewed. The patient's tolerance of                            previous anesthesia was also reviewed. The risks                            and benefits of the procedure and the sedation                            options and risks were discussed with the patient.                            All questions were answered, and informed consent                            was obtained. Prior Anticoagulants: The patient has                            taken no previous anticoagulant or antiplatelet                            agents. ASA Grade Assessment: III - A patient with                            severe systemic disease. After reviewing the risks                            and benefits, the patient was deemed in  satisfactory condition to undergo the procedure.                           After obtaining informed consent, the endoscope was                            passed under direct vision. Throughout the                            procedure, the patient's blood pressure, pulse, and                            oxygen saturations were monitored continuously. The                            Endoscope  was introduced through the mouth, and                            advanced to the second part of duodenum. The upper                            GI endoscopy was accomplished without difficulty.                            The patient tolerated the procedure well. Scope In: Scope Out: Findings:                 The larynx was normal.                           The esophagus was normal.                           The stomach was normal other than mild nonspecific                            cobblestoned mucosa.                           The cardia and gastric fundus were normal on                            retroflexion. (Hill grade 2)                           The examined duodenum was normal. Complications:            No immediate complications. Estimated Blood Loss:     Estimated blood loss: none. Impression:               - Normal larynx.                           - Normal esophagus.                           - Normal stomach.                           -  Normal examined duodenum.                           - No specimens collected.                           While patient has chronic symptoms of GERd, the                            reported throat symptoms are more likely due to                            allergies and smoking. Recommendation:           - Patient has a contact number available for                            emergencies. The signs and symptoms of potential                            delayed complications were discussed with the                            patient. Return to normal activities tomorrow.                            Written discharge instructions were provided to the                            patient.                           - Resume previous diet.                           - Continue present medications. However, if this                            patient is still on twice daily PPI, it can be                            decreased to once daily.                            - See the other procedure note for documentation of                            additional recommendations.                           - Stop smoking. Randal Yepiz L. Loletha Carrow, MD 05/09/2021 3:02:47 PM This report has been signed electronically.

## 2021-05-11 ENCOUNTER — Telehealth: Payer: Self-pay

## 2021-05-11 NOTE — Telephone Encounter (Signed)
°  Follow up Call-  Call back number 05/09/2021 07/15/2019  Post procedure Call Back phone  # [phone]  Permission to leave phone message Yes Yes  Some recent data might be hidden     Patient questions:  Do you have a fever, pain , or abdominal swelling? No. Pain Score  0 *  Have you tolerated food without any problems? Yes.    Have you been able to return to your normal activities? Yes.    Do you have any questions about your discharge instructions: Diet   No. Medications  No. Follow up visit  No.  Do you have questions or concerns about your Care? No.  Actions: * If pain score is 4 or above: No action needed, pain <4.

## 2021-05-12 ENCOUNTER — Encounter: Payer: Self-pay | Admitting: Gastroenterology

## 2021-05-13 ENCOUNTER — Telehealth: Payer: Self-pay | Admitting: Gastroenterology

## 2021-05-13 NOTE — Telephone Encounter (Signed)
Returned call to patient. I told her that a letter was sent to her through my chart on 05/12/21. Pt states that she has trouble getting into her my chart. I told her that I will place a copy in the mail for her and a copy is being sent to Dr. Bradd Burner for his review as well. Pt knows that he will be in contact with her with further recommendations. Pt verbalized understanding and had no concerns at the end of the call.

## 2021-05-13 NOTE — Telephone Encounter (Signed)
Inbound call from patient requesting results from Colonoscopy done on 2/6. Please advise.

## 2021-05-16 ENCOUNTER — Ambulatory Visit: Payer: Medicare (Managed Care)

## 2021-05-18 ENCOUNTER — Inpatient Hospital Stay: Admission: RE | Admit: 2021-05-18 | Payer: Medicare (Managed Care) | Source: Ambulatory Visit

## 2021-05-18 ENCOUNTER — Ambulatory Visit
Admission: RE | Admit: 2021-05-18 | Discharge: 2021-05-18 | Disposition: A | Discharge status: Home / Self Care | Payer: Medicare (Managed Care) | Source: Ambulatory Visit | Attending: Vascular Surgery | Admitting: Vascular Surgery

## 2021-05-18 DIAGNOSIS — I739 Peripheral vascular disease, unspecified: Secondary | ICD-10-CM

## 2021-05-18 MED ORDER — IOPAMIDOL (ISOVUE-370) INJECTION 76%
100.0000 mL | Freq: Once | INTRAVENOUS | Status: AC | PRN
  Administered 2021-05-18: 100 mL via INTRAVENOUS

## 2021-05-24 ENCOUNTER — Ambulatory Visit
Admission: RE | Admit: 2021-05-24 | Discharge: 2021-05-24 | Disposition: A | Discharge status: Home / Self Care | Payer: Medicare (Managed Care) | Source: Ambulatory Visit | Attending: Vascular Surgery | Admitting: Vascular Surgery

## 2021-05-24 DIAGNOSIS — Z1231 Encounter for screening mammogram for malignant neoplasm of breast: Secondary | ICD-10-CM

## 2021-05-25 ENCOUNTER — Other Ambulatory Visit: Payer: Self-pay

## 2021-05-25 ENCOUNTER — Ambulatory Visit (INDEPENDENT_AMBULATORY_CARE_PROVIDER_SITE_OTHER): Payer: Medicare (Managed Care) | Admitting: Vascular Surgery

## 2021-05-25 ENCOUNTER — Encounter: Payer: Self-pay | Admitting: Vascular Surgery

## 2021-05-25 VITALS — BP 124/67 | HR 100 | Temp 97.9°F | Resp 20 | Ht 61.0 in | Wt 158.0 lb

## 2021-05-25 DIAGNOSIS — I70223 Atherosclerosis of native arteries of extremities with rest pain, bilateral legs: Secondary | ICD-10-CM

## 2021-05-25 NOTE — Progress Notes (Signed)
Patient ID: Danielle Clay, female   DOB: 1951/02/18, 71 y.o.   MRN: [id number]  Reason for Consult: Follow-up   Referred by Janifer Adie, MD  Subjective:     HPI:  Danielle Clay is a 71 y.o. female with bilateral lower extremity pain right greater than left.  She has done her best to quit smoking.  She is taking aspirin and a statin.  No previous vascular inventions.  She follows up today with CT angio.  She is accompanied by her friend.  She does have a slow to heal wound on the right ankle still.  No tissue loss or ulceration other than this.  She does walk but has significant disability.  Pain is fairly constant moderate to severe.  Does not radiate.  Does have pain at rest.  Past Medical History:  Diagnosis Date   Allergy    seasonal   Arthritis    Bronchial asthma    Colon polyps    Constipation    COPD (chronic obstructive pulmonary disease) (HCC)    Early cataracts, bilateral    Fibromyalgia    Fracture    right index finger proximal phalanx fracture   GERD (gastroesophageal reflux disease)    Hypertension    Migraine    Osteoporosis    takes vitamins for this.   Wears glasses    Family History  Problem Relation Age of Onset   Diabetes Mother    Anemia Mother    HIV Sister    Heart Problems Brother    Asthma Brother    HIV Brother    HIV Brother    Esophageal cancer Maternal Uncle        can't remember when   Colon cancer Neg Hx    Colon polyps Neg Hx    Stomach cancer Neg Hx    Rectal cancer Neg Hx    Past Surgical History:  Procedure Laterality Date   BREAST EXCISIONAL BIOPSY Right    CHOLECYSTECTOMY     CLOSED REDUCTION FINGER WITH PERCUTANEOUS PINNING Right 03/05/2019   Procedure: CLOSED REDUCTION FINGER WITH PERCUTANEOUS PINNING AND OPEN REDUCTION AND INTERNAL FIXTATION RIGHT INDEX FINGER;  Surgeon: Iran Planas, MD;  Location: [place];  Service: Orthopedics;  Laterality: Right;  REGINAL WITH IV SEDATION   COLONOSCOPY     6 yrs ago with  polyps   COLONOSCOPY W/ BIOPSIES AND POLYPECTOMY     CYST REMOVAL HAND     Under left breast   GALLBLADDER SURGERY     POLYPECTOMY     TONSILLECTOMY AND ADENOIDECTOMY     TUBAL LIGATION     WISDOM TOOTH EXTRACTION      Short Social History:  Social History   Tobacco Use   Smoking status: Every Day    Packs/day: 1.00    Types: Cigarettes   Smokeless tobacco: Never  Substance Use Topics   Alcohol use: Yes    Comment: few times a week    Allergies  Allergen Reactions   Penicillins Swelling    Did it involve swelling of the face/tongue/throat, SOB, or low BP? Yes Did it involve sudden or severe rash/hives, skin peeling, or any reaction on the inside of your mouth or nose? Yes Did you need to seek medical attention at a hospital or doctor's office? Yes When did it last happen?      1970 If all above answers are NO, may proceed with cephalosporin use.    Percocet [Oxycodone-Acetaminophen] Itching    Current  Outpatient Medications  Medication Sig Dispense Refill   Acetaminophen 500 MG capsule Take 2 capsules by mouth 2 (two) times daily.     amLODipine (NORVASC) 10 MG tablet Take 10 mg by mouth daily.     aspirin EC 81 MG tablet Take 81 mg by mouth daily.     atorvastatin (LIPITOR) 20 MG tablet Take 20 mg by mouth daily.     budesonide-formoterol (SYMBICORT) 160-4.5 MCG/ACT inhaler Inhale 2 puffs into the lungs 2 (two) times daily.     DULoxetine (CYMBALTA) 60 MG capsule Take 1 capsule (60 mg total) by mouth daily. 30 capsule 1   guaiFENesin (MUCINEX) 600 MG 12 hr tablet Take 600 mg by mouth 2 (two) times daily as needed.     LOSARTAN POTASSIUM PO Take by mouth. Take 25mg  daily     montelukast (SINGULAIR) 10 MG tablet Take 10 mg by mouth daily.     pantoprazole (PROTONIX) 40 MG tablet Take 40 mg by mouth daily.     pregabalin (LYRICA) 50 MG capsule Take 50 mg by mouth 2 (two) times daily.     Suvorexant (BELSOMRA) 20 MG TABS Take 20 mg by mouth at bedtime.      traZODone  (DESYREL) 50 MG tablet Take 50 mg by mouth at bedtime.     Vitamin D, Cholecalciferol, 25 MCG (1000 UT) TABS Take 1 tablet by mouth daily.     No current facility-administered medications for this visit.    Review of Systems  Constitutional:  Constitutional negative. HENT: HENT negative.  Eyes: Eyes negative.  Cardiovascular: Cardiovascular negative.  GI: Gastrointestinal negative.  Musculoskeletal: Positive for gait problem and leg pain.  Skin: Positive for wound.  Hematologic: Hematologic/lymphatic negative.  Psychiatric: Psychiatric negative.       Objective:  Objective   Vitals:   05/25/21 1506  BP: 124/67  Pulse: 100  Resp: 20  Temp: 97.9 F (36.6 C)  SpO2: 92%  Weight: 158 lb (71.7 kg)  Height: 5\' 1"  (1.549 m)   Body mass index is 29.85 kg/m.  Physical Exam HENT:     Head: Normocephalic.     Nose:     Comments: Wearing a mask Eyes:     Pupils: Pupils are equal, round, and reactive to light.  Cardiovascular:     Rate and Rhythm: Normal rate.     Pulses:          Femoral pulses are 0 on the right side and 0 on the left side.      Popliteal pulses are 0 on the right side and 0 on the left side.  Pulmonary:     Effort: Pulmonary effort is normal.  Abdominal:     General: Abdomen is flat.     Palpations: Abdomen is soft.  Musculoskeletal:     Right lower leg: No edema.     Left lower leg: No edema.  Skin:    Comments: Toes have dark discoloration Slow to heal wound right anterior ankle  Neurological:     General: No focal deficit present.     Mental Status: She is alert.    Data: CTA IMPRESSION: VASCULAR   1. Advanced right lower extremity inflow and outflow peripheral arterial disease with critical stenosis versus short segment occlusion of the right external iliac artery and long segment chronic total occlusion of the right superficial femoral artery from the origin into the adductor canal. 2. Advanced left lower extremity inflow and outflow  peripheral arterial disease with critical  stenosis of the right external iliac artery, tandem stenoses of the proximal superficial femoral artery and moderate segment chronic total occlusion of the mid and distal superficial femoral artery. 3. Three-vessel runoff to the feet bilaterally. 4. Moderate stenosis at the origin of the celiac artery. 5. At least moderate stenoses of the 2 left-sided renal arteries. 6. Moderate stenosis of the origin of the IMA. 7. Aortic atherosclerotic calcifications without aneurysm or dissection.   NON-VASCULAR   1. Combined paraseptal and centrilobular pulmonary emphysema. 2. Punctate nonobstructing stone in the interpolar right renal collecting system. 3. Additional ancillary findings as above.  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  We will begin with right lower extremity evaluation from the left common femoral approach to possibly include stenting of the bilateral external iliac arteries and possible endovascular treatment of the right SFA.  We can only treat the inflow we will plan to follow her up and see if this improves her symptoms significantly otherwise she would need bypass surgery.  All this was discussed with her today in the presence of a friend.  She is working on smoking cessation.  She continues on aspirin and statin.     Waynetta Sandy MD [facility]

## 2021-05-26 ENCOUNTER — Other Ambulatory Visit: Payer: Self-pay | Admitting: Vascular Surgery

## 2021-05-26 DIAGNOSIS — Z1231 Encounter for screening mammogram for malignant neoplasm of breast: Secondary | ICD-10-CM

## 2021-05-30 ENCOUNTER — Other Ambulatory Visit: Payer: Self-pay

## 2021-06-06 ENCOUNTER — Ambulatory Visit (HOSPITAL_COMMUNITY)
Admission: RE | Admit: 2021-06-06 | Payer: Medicare (Managed Care) | Source: Home / Self Care | Admitting: Vascular Surgery

## 2021-06-06 ENCOUNTER — Encounter (HOSPITAL_COMMUNITY): Admission: RE | Payer: Self-pay | Source: Home / Self Care

## 2021-06-06 SURGERY — ABDOMINAL AORTOGRAM W/LOWER EXTREMITY
Anesthesia: LOCAL

## 2021-06-20 ENCOUNTER — Other Ambulatory Visit: Payer: Self-pay

## 2021-06-20 ENCOUNTER — Ambulatory Visit (HOSPITAL_COMMUNITY)
Admission: RE | Admit: 2021-06-20 | Discharge: 2021-06-20 | Disposition: A | Discharge status: Home / Self Care | Payer: Medicare (Managed Care) | Attending: Vascular Surgery | Admitting: Vascular Surgery

## 2021-06-20 ENCOUNTER — Encounter (HOSPITAL_COMMUNITY): Payer: Self-pay | Admitting: Vascular Surgery

## 2021-06-20 ENCOUNTER — Encounter (HOSPITAL_COMMUNITY)
Admission: RE | Disposition: A | Discharge status: Home / Self Care | Payer: Self-pay | Source: Home / Self Care | Attending: Vascular Surgery

## 2021-06-20 DIAGNOSIS — Z87891 Personal history of nicotine dependence: Secondary | ICD-10-CM | POA: Diagnosis not present

## 2021-06-20 DIAGNOSIS — Z79899 Other long term (current) drug therapy: Secondary | ICD-10-CM | POA: Insufficient documentation

## 2021-06-20 DIAGNOSIS — J449 Chronic obstructive pulmonary disease, unspecified: Secondary | ICD-10-CM | POA: Diagnosis not present

## 2021-06-20 DIAGNOSIS — I70221 Atherosclerosis of native arteries of extremities with rest pain, right leg: Secondary | ICD-10-CM | POA: Diagnosis not present

## 2021-06-20 DIAGNOSIS — I10 Essential (primary) hypertension: Secondary | ICD-10-CM | POA: Diagnosis not present

## 2021-06-20 DIAGNOSIS — Z7982 Long term (current) use of aspirin: Secondary | ICD-10-CM | POA: Insufficient documentation

## 2021-06-20 DIAGNOSIS — I70223 Atherosclerosis of native arteries of extremities with rest pain, bilateral legs: Secondary | ICD-10-CM | POA: Diagnosis present

## 2021-06-20 HISTORY — PX: PERIPHERAL VASCULAR INTERVENTION: CATH118257

## 2021-06-20 HISTORY — PX: ABDOMINAL AORTOGRAM W/LOWER EXTREMITY: CATH118223

## 2021-06-20 LAB — POCT I-STAT, CHEM 8
BUN: 10 mg/dL (ref 8–23)
Calcium, Ion: 1.32 mmol/L (ref 1.15–1.40)
Chloride: 101 mmol/L (ref 98–111)
Creatinine, Ser: 0.6 mg/dL (ref 0.44–1.00)
Glucose, Bld: 108 mg/dL — ABNORMAL HIGH (ref 70–99)
HCT: 43 % (ref 36.0–46.0)
Hemoglobin: 14.6 g/dL (ref 12.0–15.0)
Potassium: 5.3 mmol/L — ABNORMAL HIGH (ref 3.5–5.1)
Sodium: 139 mmol/L (ref 135–145)
TCO2: 34 mmol/L — ABNORMAL HIGH (ref 22–32)

## 2021-06-20 SURGERY — ABDOMINAL AORTOGRAM W/LOWER EXTREMITY
Anesthesia: LOCAL

## 2021-06-20 MED ORDER — HEPARIN (PORCINE) IN NACL 1000-0.9 UT/500ML-% IV SOLN
INTRAVENOUS | Status: DC | PRN
  Administered 2021-06-20 (×2): 500 mL via INTRA_ARTERIAL

## 2021-06-20 MED ORDER — HEPARIN SODIUM (PORCINE) 1000 UNIT/ML IJ SOLN
INTRAMUSCULAR | Status: AC
  Filled 2021-06-20: qty 10

## 2021-06-20 MED ORDER — CLOPIDOGREL BISULFATE 300 MG PO TABS
ORAL_TABLET | ORAL | Status: DC | PRN
  Administered 2021-06-20: 300 mg via ORAL

## 2021-06-20 MED ORDER — MIDAZOLAM HCL 2 MG/2ML IJ SOLN
INTRAMUSCULAR | Status: AC
  Filled 2021-06-20: qty 2

## 2021-06-20 MED ORDER — FENTANYL CITRATE (PF) 100 MCG/2ML IJ SOLN
INTRAMUSCULAR | Status: DC | PRN
  Administered 2021-06-20: 50 ug via INTRAVENOUS

## 2021-06-20 MED ORDER — LIDOCAINE HCL (PF) 1 % IJ SOLN
INTRAMUSCULAR | Status: AC
  Filled 2021-06-20: qty 30

## 2021-06-20 MED ORDER — ONDANSETRON HCL 4 MG/2ML IJ SOLN: 4.0000 mg | Freq: Four times a day (QID) | INTRAMUSCULAR | Status: DC | PRN

## 2021-06-20 MED ORDER — CLOPIDOGREL BISULFATE 300 MG PO TABS
ORAL_TABLET | ORAL | Status: AC
Start: 2021-06-20 — End: ?
  Filled 2021-06-20: qty 1

## 2021-06-20 MED ORDER — SODIUM CHLORIDE 0.9 % IV SOLN: INTRAVENOUS | Status: DC

## 2021-06-20 MED ORDER — CLOPIDOGREL BISULFATE 75 MG PO TABS: 75.0000 mg | ORAL_TABLET | Freq: Every day | ORAL | Status: DC

## 2021-06-20 MED ORDER — MORPHINE SULFATE (PF) 2 MG/ML IV SOLN: 2.0000 mg | INTRAVENOUS | Status: DC | PRN

## 2021-06-20 MED ORDER — HEPARIN (PORCINE) IN NACL 1000-0.9 UT/500ML-% IV SOLN
INTRAVENOUS | Status: AC
  Filled 2021-06-20: qty 1000

## 2021-06-20 MED ORDER — CLOPIDOGREL BISULFATE 75 MG PO TABS: 300.0000 mg | ORAL_TABLET | Freq: Once | ORAL | Status: AC

## 2021-06-20 MED ORDER — SODIUM CHLORIDE 0.9 % IV SOLN: 250.0000 mL | INTRAVENOUS | Status: DC | PRN

## 2021-06-20 MED ORDER — HYDRALAZINE HCL 20 MG/ML IJ SOLN: 5.0000 mg | INTRAMUSCULAR | Status: DC | PRN

## 2021-06-20 MED ORDER — SODIUM CHLORIDE 0.9% FLUSH: 3.0000 mL | Freq: Two times a day (BID) | INTRAVENOUS | Status: DC

## 2021-06-20 MED ORDER — HEPARIN SODIUM (PORCINE) 1000 UNIT/ML IJ SOLN
INTRAMUSCULAR | Status: DC | PRN
  Administered 2021-06-20: 5000 [IU] via INTRAVENOUS

## 2021-06-20 MED ORDER — CLOPIDOGREL BISULFATE 75 MG PO TABS: 75.0000 mg | ORAL_TABLET | Freq: Every day | ORAL | 11 refills | Status: AC

## 2021-06-20 MED ORDER — MIDAZOLAM HCL 2 MG/2ML IJ SOLN
INTRAMUSCULAR | Status: DC | PRN
Start: 2021-06-20 — End: 2021-06-20
  Administered 2021-06-20: 1 mg via INTRAVENOUS

## 2021-06-20 MED ORDER — IODIXANOL 320 MG/ML IV SOLN
INTRAVENOUS | Status: DC | PRN
  Administered 2021-06-20: 147 mL via INTRA_ARTERIAL

## 2021-06-20 MED ORDER — FENTANYL CITRATE (PF) 100 MCG/2ML IJ SOLN
INTRAMUSCULAR | Status: AC
Start: 2021-06-20 — End: ?
  Filled 2021-06-20: qty 2

## 2021-06-20 MED ORDER — LABETALOL HCL 5 MG/ML IV SOLN: 10.0000 mg | INTRAVENOUS | Status: DC | PRN

## 2021-06-20 MED ORDER — SODIUM CHLORIDE 0.9% FLUSH: 3.0000 mL | INTRAVENOUS | Status: DC | PRN

## 2021-06-20 SURGICAL SUPPLY — 18 items
BALLN MUSTANG 6.0X40 75 (BALLOONS) ×3
BALLOON MUSTANG 6.0X40 75 (BALLOONS) IMPLANT
CATH ANGIO 5F BER2 65CM (CATHETERS) ×1 IMPLANT
CATH OMNI FLUSH 5F 65CM (CATHETERS) ×1 IMPLANT
CLOSURE MYNX CONTROL 6F/7F (Vascular Products) ×1 IMPLANT
KIT ENCORE 26 ADVANTAGE (KITS) ×1 IMPLANT
KIT MICROPUNCTURE NIT STIFF (SHEATH) ×1 IMPLANT
KIT PV (KITS) ×3 IMPLANT
SHEATH PINNACLE 5F 10CM (SHEATH) ×1 IMPLANT
SHEATH PINNACLE 6F 10CM (SHEATH) ×1 IMPLANT
SHEATH PINNACLE ST 6F 45CM (SHEATH) ×1 IMPLANT
STENT ELUVIA 6X40X130 (Permanent Stent) ×1 IMPLANT
STENT ELUVIA 6X60X130 (Permanent Stent) ×1 IMPLANT
SYR MEDRAD MARK V 150ML (SYRINGE) ×1 IMPLANT
TRANSDUCER W/STOPCOCK (MISCELLANEOUS) ×3 IMPLANT
TRAY PV CATH (CUSTOM PROCEDURE TRAY) ×3 IMPLANT
WIRE BENTSON .035X145CM (WIRE) ×1 IMPLANT
WIRE ROSEN-J .035X260CM (WIRE) ×1 IMPLANT

## 2021-06-20 NOTE — Op Note (Signed)
? ? ?Patient name: Danielle Clay MRN: [id number] DOB: 1950/08/24 Sex: female ? 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 Donzetta Matters, MD ?Procedure Performed: ?1.  Ultrasound-guided cannulation left common femoral artery ?2.  Aortogram with bilateral lower extremity runoff ?3.  Stent of right external iliac artery with 6 x 60 mm Eluvia ?4.  Stent of left external iliac artery with a 4 x 60 mm Eluvia ?5.  Moderate sedation with fentanyl and Versed for 37 minutes ?6.  Mynx device closure left common femoral artery ? ? ?Indications: 71 year old female presented with rest pain of the right lower extremity with very short distance claudication as well.  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  She is now indicated for angiography with possible intervention. ? ?Findings: The aorta and common iliac arteries are patent as are the bilateral hypogastric arteries.  The right external neck artery is occluded for approximately 5 cm this was stented to 0% stenosis.  On the left side she is 80% stenosed for approximately 3 cm and this was stented to 0% stenosis as well.  The right side which is the site of interest the SFA is flush occluded she reconstitutes above the knee popliteal artery which is still disease below the knee she has 3 vessels runoff.  On the left side she does have a takeoff of the SFA which is diminutive but there is then long segment occlusion of the left SFA reconstituting the popliteal artery at the knee with three-vessel runoff to the foot. ? ?If patient's symptoms do not resolve she will need consideration of right femoropopliteal bypass. ?  ?Procedure:  The patient was identified in the holding area and taken to room 8.  The patient was then placed supine on the table and prepped and draped in the usual sterile fashion.   A time out was called.  Ultrasound was used to evaluate the left common femoral artery.  This was free of disease.  The area was anesthetized 1% lidocaine cannulated micropuncture needle followed by wire and sheath.  And images saved the permanent record.  Concomitantly moderate sedation was administered with fentanyl and Versed and her vital signs were monitored throughout the case.  Bentson wires placed and we needed Berenstein catheter to traverse the tight stenosis of the external neck artery.  Bentson wire was then into the aorta were replaced and Omni catheter performed aortogram followed by pelvic angiography with angled views followed by bilateral extremity runoff.  With the above findings we elected intervention.  I was able to cross the occluded external neck artery on the right from the left side with a Bentson wire I then confirmed intraluminal access placed a Rosen wire.  A long 6 French sheath was placed and the patient was heparinized with 5000 units of heparin.  We primarily stented with a 6 x 60 mm drug-eluting stent and this was postdilated with a 6 mm balloon to 0% residual stenosis.  Sheath was then retracted into the left external neck artery distal to the stenosis and angiography was performed in RAO projection.  This was primarily stented with a 4 x 60 mm drug-eluting stent postdilated with a 6 mm balloon to 0% residual stenosis.  Satisfied with this we exchanged for short 6 French sheath deployed a minx device.  She tolerated the procedure without immediate complication. ? ?EBL: 147 ? ?Hermann Dottavio C.  Donzetta Matters, MD ?[facility] ?Office: [phone] ?Pager: [phone] ? ? ?

## 2021-06-20 NOTE — Interval H&P Note (Signed)
History and Physical Interval Note: ? ?06/20/2021 ?7:23 AM ? ?Danielle Clay  has presented today for surgery, with the diagnosis of critical limb ischemia.  The various methods of treatment have been discussed with the patient and family. After consideration of risks, benefits and other options for treatment, the patient has consented to  Procedure(s): ?ABDOMINAL AORTOGRAM W/LOWER EXTREMITY (N/A) as a surgical intervention.  The patient's history has been reviewed, patient examined, no change in status, stable for surgery.  I have reviewed the patient's chart and labs.  Questions were answered to the patient's satisfaction.   ? ? ?Servando Snare ? ? ?

## 2021-06-22 MED FILL — Lidocaine HCl Local Preservative Free (PF) Inj 1%: INTRAMUSCULAR | Qty: 30 | Status: AC

## 2021-07-27 ENCOUNTER — Other Ambulatory Visit: Payer: Self-pay

## 2021-07-27 DIAGNOSIS — Z0000 Encounter for general adult medical examination without abnormal findings: Secondary | ICD-10-CM | POA: Insufficient documentation

## 2021-07-27 DIAGNOSIS — M545 Low back pain, unspecified: Secondary | ICD-10-CM | POA: Insufficient documentation

## 2021-07-27 DIAGNOSIS — I10 Essential (primary) hypertension: Secondary | ICD-10-CM | POA: Insufficient documentation

## 2021-07-27 DIAGNOSIS — E559 Vitamin D deficiency, unspecified: Secondary | ICD-10-CM | POA: Insufficient documentation

## 2021-07-27 DIAGNOSIS — G894 Chronic pain syndrome: Secondary | ICD-10-CM | POA: Insufficient documentation

## 2021-07-27 DIAGNOSIS — I70223 Atherosclerosis of native arteries of extremities with rest pain, bilateral legs: Secondary | ICD-10-CM

## 2021-07-27 DIAGNOSIS — Z87410 Personal history of cervical dysplasia: Secondary | ICD-10-CM | POA: Insufficient documentation

## 2021-08-10 ENCOUNTER — Encounter (HOSPITAL_COMMUNITY): Payer: Medicare (Managed Care)

## 2021-10-05 ENCOUNTER — Encounter (HOSPITAL_COMMUNITY): Payer: Medicare (Managed Care)

## 2021-10-05 ENCOUNTER — Encounter: Payer: Medicare (Managed Care) | Admitting: Vascular Surgery

## 2021-10-11 ENCOUNTER — Telehealth: Payer: Self-pay

## 2021-10-11 NOTE — Telephone Encounter (Signed)
Danielle Clay with [facility] called asking if pt is to remain on ASA and Plavix.  Reviewed pt's chart, returned call, two identifiers used. Informed her of missed appts and upcoming appts. Informed her that d/t the pt having stents placed, she would need to continue anticoagulant therapies for potentially life, unless otherwise specified by the provider. Confirmed understanding.

## 2021-10-18 ENCOUNTER — Other Ambulatory Visit: Payer: Self-pay | Admitting: Family Medicine

## 2021-10-18 DIAGNOSIS — Z720 Tobacco use: Secondary | ICD-10-CM

## 2021-10-18 DIAGNOSIS — Z0000 Encounter for general adult medical examination without abnormal findings: Secondary | ICD-10-CM

## 2021-10-29 ENCOUNTER — Emergency Department (HOSPITAL_COMMUNITY)
Admission: EM | Admit: 2021-10-29 | Discharge: 2021-10-29 | Disposition: A | Discharge status: Home / Self Care | Payer: Medicare (Managed Care) | Attending: Emergency Medicine | Admitting: Emergency Medicine

## 2021-10-29 ENCOUNTER — Other Ambulatory Visit: Payer: Self-pay

## 2021-10-29 ENCOUNTER — Encounter (HOSPITAL_COMMUNITY): Payer: Self-pay

## 2021-10-29 ENCOUNTER — Emergency Department (HOSPITAL_COMMUNITY): Payer: Medicare (Managed Care)

## 2021-10-29 DIAGNOSIS — X58XXXA Exposure to other specified factors, initial encounter: Secondary | ICD-10-CM | POA: Insufficient documentation

## 2021-10-29 DIAGNOSIS — R6889 Other general symptoms and signs: Secondary | ICD-10-CM

## 2021-10-29 DIAGNOSIS — Z7982 Long term (current) use of aspirin: Secondary | ICD-10-CM | POA: Diagnosis not present

## 2021-10-29 DIAGNOSIS — Z7902 Long term (current) use of antithrombotics/antiplatelets: Secondary | ICD-10-CM | POA: Diagnosis not present

## 2021-10-29 DIAGNOSIS — T171XXA Foreign body in nostril, initial encounter: Secondary | ICD-10-CM | POA: Diagnosis present

## 2021-10-29 NOTE — ED Provider Notes (Signed)
[facility] DEPT Provider Note   CSN: [id number] Arrival date & time: 10/29/21  1812     History Chief Complaint  Patient presents with   Foreign Body in Nose    Danielle Clay is a 71 y.o. female presents the emergency department for evaluation of a foreign body stuck in her nose for the past 3 days.  Patient reports that she feels like she can feel things metallic in her nose.  She told me that it is a straight post nose stud.   Foreign Body in [place] Medications Prior to Admission medications   Medication Sig Start Date End Date Taking? Authorizing Provider  albuterol (VENTOLIN HFA) 108 (90 Base) MCG/ACT inhaler Inhale 2 puffs into the lungs every 6 (six) hours as needed for wheezing or shortness of breath.    [provider]  amLODipine (NORVASC) 10 MG tablet Take 10 mg by mouth daily.    [provider]  aspirin EC 81 MG tablet Take 81 mg by mouth daily.    [provider]  atorvastatin (LIPITOR) 20 MG tablet Take 20 mg by mouth daily.    [provider]  Azelastine-Fluticasone 137-50 MCG/ACT SUSP Place 1 spray into the nose in the morning and at bedtime.    [provider]  budesonide-formoterol (SYMBICORT) 160-4.5 MCG/ACT inhaler Inhale 2 puffs into the lungs 2 (two) times daily.    [provider]  Camphor-Menthol-Methyl Sal (SALONPAS) 3.04-08-08 % PTCH Place 3 patches onto the skin as directed. Apply 1 patch to each knee & apply 1 patch to low back daily    [provider]  cholecalciferol (VITAMIN D) 25 MCG (1000 UNIT) tablet Take 1,000 Units by mouth daily.    [provider]  clopidogrel (PLAVIX) 75 MG tablet Take 1 tablet (75 mg total) by mouth daily. 06/20/21 06/20/22  Waynetta Sandy, MD  cyclobenzaprine (FLEXERIL) 5 MG tablet Take 5 mg by mouth 2 (two) times daily as needed for muscle spasms.    [provider]  DULoxetine (CYMBALTA) 60 MG  capsule Take 1 capsule (60 mg total) by mouth daily. 01/10/17   Jamse Arn, MD  guaiFENesin (MUCINEX) 600 MG 12 hr tablet Take 600 mg by mouth 2 (two) times daily as needed for cough or to loosen phlegm.    [provider]  hydroxypropyl methylcellulose / hypromellose (ISOPTO TEARS / GONIOVISC) 2.5 % ophthalmic solution Place 1 drop into both eyes 3 (three) times daily as needed for dry eyes.    [provider]  losartan (COZAAR) 25 MG tablet Take 25 mg by mouth daily.    [provider]  Menthol, Topical Analgesic, (BIOFREEZE) 4 % GEL Apply 1 application. topically 2 (two) times daily as needed (pain.).    [provider]  mineral oil-hydrophilic petrolatum (AQUAPHOR) ointment Apply 1 application. topically as needed for dry skin.    [provider]  montelukast (SINGULAIR) 10 MG tablet Take 10 mg by mouth daily.    [provider]  pantoprazole (PROTONIX) 40 MG tablet Take 40 mg by mouth in the morning and at bedtime.    [provider]  pregabalin (LYRICA) 75 MG capsule Take 75 mg by mouth 2 (two) times daily.    [provider]  senna-docusate (SENOKOT-S) 8.6-50 MG tablet Take 1 tablet by mouth at bedtime as needed for mild constipation.    [provider]  Suvorexant (BELSOMRA) 20 MG TABS  Take 20 mg by mouth at bedtime.     [provider]  traZODone (DESYREL) 50 MG tablet Take 50 mg by mouth at bedtime.    [provider]  Varenicline Tartrate, Starter, 0.5 MG X 11 & 1 MG X 42 TBPK Take 0.5-1 mg by mouth as directed. Take 1 tablet (0.5 mg) once daily x 3 days, take 1 tablet (0.5 mg) by mouth twice daily on days 4-7, increase to 1 tablet (1 mg) by mouth twice daily thereafter.    [provider]      Allergies    Penicillins and Percocet [oxycodone-acetaminophen]    Review of Systems   Review of Systems  Constitutional:  Negative for chills and fever.  HENT:         Reports  foreign body in nose.    Physical Exam Updated Vital Signs BP 128/84 (BP Location: Left Arm)   Pulse 100   Temp 98.3 F (36.8 C) (Oral)   Resp 18   SpO2 94%  Physical Exam Vitals and nursing note reviewed.  Constitutional:      Appearance: Normal appearance.  HENT:     Nose:     Comments: Piercing seen to the outside of the right nostril.  She does have some scarring on the inside of her nostril near the piercing.  I do not see any increased erythema or laceration.  No nose ring seen. Eyes:     General: No scleral icterus. Pulmonary:     Effort: Pulmonary effort is normal. No respiratory distress.  Skin:    General: Skin is dry.     Findings: No rash.  Neurological:     General: No focal deficit present.     Mental Status: She is alert. Mental status is at baseline.  Psychiatric:        Mood and Affect: Mood normal.     ED Results / Procedures / Treatments   Labs (all labs ordered are listed, but only abnormal results are displayed) Labs Reviewed - No data to display  EKG None  Radiology No results found.  Procedures Procedures   Medications Ordered in ED Medications - No data to display  ED Course/ Medical Decision Making/ A&P                           Medical Decision Making Amount and/or Complexity of Data Reviewed Radiology: ordered.   71 year old female presents the emergency room for evaluation of possible loss nose ring.  Patient reports that she woke up and her nose ring was on her nose anymore and she thinks that she can feel it upper nose.  I cannot see the nose ring in her nose.  We will order plain film to see if there is anything in her nose.  The patient assures me that the ring in her nose was metal.  Hopeful likely will be radiopaque.  Handing off patient to oncoming shift to review imaging.  8:22 PM Care of Danielle Clay transferred to [place] at the end of my shift as the patient will require reassessment once labs/imaging  have resulted. Patient presentation, ED course, and plan of care discussed with review of all pertinent labs and imaging. Please see his/her note for further details regarding further ED course and disposition. Plan at time of handoff is follow up on XR imaging. May possibly need ENT referral or consult. This may be altered or completely changed at the  discretion of the oncoming team pending results of further workup.  Final Clinical Impression(s) / ED Diagnoses Final diagnoses:  None    Rx / DC Orders ED Discharge Orders     None         Sherrell Puller, Hershal Coria 10/29/21 2035    Fransico Meadow, MD 10/31/21 1343

## 2021-10-29 NOTE — ED Triage Notes (Signed)
Pt reports her nose ring is embedded in her nose since Thursday.

## 2021-10-29 NOTE — ED Provider Notes (Signed)
  Physical Exam  BP 128/84 (BP Location: Left Arm)   Pulse 100   Temp 98.3 F (36.8 C) (Oral)   Resp 18   SpO2 94%   Physical Exam Vitals and nursing note reviewed.  Constitutional:      Appearance: Normal appearance.  HENT:     Head: Normocephalic and atraumatic.     Nose: Nose normal.     Right Nostril: No foreign body, epistaxis or occlusion.     Left Nostril: No foreign body, epistaxis or occlusion.  Eyes:     Conjunctiva/sclera: Conjunctivae normal.  Pulmonary:     Effort: Pulmonary effort is normal. No respiratory distress.  Skin:    General: Skin is warm and dry.  Neurological:     Mental Status: She is alert.  Psychiatric:        Mood and Affect: Mood normal.        Behavior: Behavior normal.    ED Course / MDM    Medical Decision Making Amount and/or Complexity of Data Reviewed Radiology: ordered.  Accepted handoff at shift change from [facility]. Please see prior provider note for full HPI.  Briefly: Patient is 71 year old female who presents to the ER for possible foreign body in nose. Concerned missing nose piercing may be lodged in nose.   DDX/Plan: Plan at time of shift change was to follow-up on patient's imaging to rule out radiopaque foreign body.  DG Nasal Bones  Result Date: 10/29/2021 CLINICAL DATA:  Concern for retained nose ring in the nostrils. Rule out metallic foreign object. EXAM: NASAL BONES - 3+ VIEW COMPARISON:  None Available. FINDINGS: No metallic foreign object.  No acute fracture or dislocation. IMPRESSION: Negative. Electronically Signed   By: Anner Crete M.D.   On: 10/29/2021 20:30     Discussed the results with the patient.  On my exam I also see no foreign body.  She is supposed to have some follow-up done for a previous right ear infection, and I explained that it would be beneficial for her to mention to them the foreign body sensation she is having in her nose.  She has a follow-up appointment next week. Also given contact  information for our ENT providers in case symptoms worsen.  We discussed reasons return to the emergency department, and patient is agreeable to the plan.    Estill Cotta 10/29/21 2201    Sherwood Gambler, MD 10/31/21 2251

## 2021-10-29 NOTE — Discharge Instructions (Signed)
You were seen in the emergency department for foreign body sensation in your nose.   As we discussed, we did not see evidence of your nose piercing on x-ray and I did not visualize any foreign bodies in your nose.   I'm attaching the contact info for ENT for you to call and make an appointment if needed.

## 2021-11-04 ENCOUNTER — Encounter (HOSPITAL_COMMUNITY): Payer: Medicare (Managed Care)

## 2021-11-16 ENCOUNTER — Encounter: Payer: Medicare (Managed Care) | Admitting: Vascular Surgery

## 2021-11-25 ENCOUNTER — Other Ambulatory Visit: Payer: Medicare (Managed Care)

## 2021-12-08 ENCOUNTER — Ambulatory Visit (INDEPENDENT_AMBULATORY_CARE_PROVIDER_SITE_OTHER)
Admission: RE | Admit: 2021-12-08 | Discharge: 2021-12-08 | Disposition: A | Discharge status: Home / Self Care | Payer: Medicare (Managed Care) | Source: Ambulatory Visit | Attending: Vascular Surgery | Admitting: Vascular Surgery

## 2021-12-08 ENCOUNTER — Ambulatory Visit (HOSPITAL_COMMUNITY)
Admission: RE | Admit: 2021-12-08 | Discharge: 2021-12-08 | Disposition: A | Discharge status: Home / Self Care | Payer: Medicare (Managed Care) | Source: Ambulatory Visit | Attending: Vascular Surgery | Admitting: Vascular Surgery

## 2021-12-08 DIAGNOSIS — I70223 Atherosclerosis of native arteries of extremities with rest pain, bilateral legs: Secondary | ICD-10-CM | POA: Diagnosis not present

## 2021-12-20 ENCOUNTER — Inpatient Hospital Stay: Admission: RE | Admit: 2021-12-20 | Payer: Medicare (Managed Care) | Source: Ambulatory Visit

## 2022-01-11 ENCOUNTER — Other Ambulatory Visit (HOSPITAL_COMMUNITY): Payer: Self-pay | Admitting: Family Medicine

## 2022-01-11 ENCOUNTER — Encounter: Payer: Self-pay | Admitting: Vascular Surgery

## 2022-01-11 ENCOUNTER — Ambulatory Visit (HOSPITAL_COMMUNITY)
Admission: RE | Admit: 2022-01-11 | Discharge: 2022-01-11 | Disposition: A | Discharge status: Home / Self Care | Payer: Medicare (Managed Care) | Source: Ambulatory Visit | Attending: Vascular Surgery | Admitting: Vascular Surgery

## 2022-01-11 ENCOUNTER — Ambulatory Visit (INDEPENDENT_AMBULATORY_CARE_PROVIDER_SITE_OTHER): Payer: Medicare (Managed Care) | Admitting: Vascular Surgery

## 2022-01-11 VITALS — BP 118/79 | HR 85 | Temp 97.7°F | Resp 20 | Ht 62.0 in | Wt 170.0 lb

## 2022-01-11 DIAGNOSIS — I70223 Atherosclerosis of native arteries of extremities with rest pain, bilateral legs: Secondary | ICD-10-CM

## 2022-01-11 DIAGNOSIS — I739 Peripheral vascular disease, unspecified: Secondary | ICD-10-CM

## 2022-01-11 DIAGNOSIS — I129 Hypertensive chronic kidney disease with stage 1 through stage 4 chronic kidney disease, or unspecified chronic kidney disease: Secondary | ICD-10-CM

## 2022-01-11 DIAGNOSIS — E785 Hyperlipidemia, unspecified: Secondary | ICD-10-CM

## 2022-01-11 NOTE — Progress Notes (Signed)
Patient ID: Danielle Clay, female   DOB: 1950/05/27, 71 y.o.   MRN: [id number]  Reason for Consult: Routine Post Op   Referred by Janifer Adie, MD  Subjective:     HPI:  Danielle Clay is a 71 y.o. female status post bilateral external neck artery stenting that was performed for chronic limb threatening ischemia with rest pain.  She does have back pain radiating down the lateral and anterior part of her legs states that she had discussed back surgery in the past but this was never performed.  Her rest pain has resolved since our intervention.  She does continue on aspirin and Plavix.  She also quit smoking we congratulated her on this today.  Past Medical History:  Diagnosis Date   Allergy    seasonal   Arthritis    Bronchial asthma    Colon polyps    Constipation    COPD (chronic obstructive pulmonary disease) (HCC)    Early cataracts, bilateral    Fibromyalgia    Fracture    right index finger proximal phalanx fracture   GERD (gastroesophageal reflux disease)    Hypertension    Migraine    Osteoporosis    takes vitamins for this.   Wears glasses    Family History  Problem Relation Age of Onset   Diabetes Mother    Anemia Mother    HIV Sister    Heart Problems Brother    Asthma Brother    HIV Brother    HIV Brother    Esophageal cancer Maternal Uncle        can't remember when   Colon cancer Neg Hx    Colon polyps Neg Hx    Stomach cancer Neg Hx    Rectal cancer Neg Hx    Past Surgical History:  Procedure Laterality Date   ABDOMINAL AORTOGRAM W/LOWER EXTREMITY N/A 06/20/2021   Procedure: ABDOMINAL AORTOGRAM W/LOWER EXTREMITY;  Surgeon: Waynetta Sandy, MD;  Location: [facility];  Service: Cardiovascular;  Laterality: N/A;   BREAST EXCISIONAL BIOPSY Right    CHOLECYSTECTOMY     CLOSED REDUCTION FINGER WITH PERCUTANEOUS PINNING Right 03/05/2019   Procedure: CLOSED REDUCTION FINGER WITH PERCUTANEOUS PINNING AND OPEN REDUCTION AND INTERNAL  FIXTATION RIGHT INDEX FINGER;  Surgeon: Iran Planas, MD;  Location: [facility] [facility];  Service: Orthopedics;  Laterality: Right;  REGINAL WITH IV SEDATION   COLONOSCOPY     6 yrs ago with polyps   COLONOSCOPY W/ BIOPSIES AND POLYPECTOMY     CYST REMOVAL HAND     Under left breast   GALLBLADDER SURGERY     PERIPHERAL VASCULAR INTERVENTION Bilateral 06/20/2021   Procedure: PERIPHERAL VASCULAR INTERVENTION;  Surgeon: Waynetta Sandy, MD;  Location: [facility];  Service: Cardiovascular;  Laterality: Bilateral;  External Iliacs   POLYPECTOMY     TONSILLECTOMY AND ADENOIDECTOMY     TUBAL LIGATION     WISDOM TOOTH EXTRACTION      Short Social History:  Social History   Tobacco Use   Smoking status: Former    Packs/day: 1.00    Types: Cigarettes    Quit date: 12/29/2021    Years since quitting: 0.0   Smokeless tobacco: Never  Substance Use Topics   Alcohol use: Yes    Comment: few times a week    Allergies  Allergen Reactions   Penicillins Swelling    Did it involve swelling of the face/tongue/throat, SOB, or low BP? Yes Did it involve sudden  or severe rash/hives, skin peeling, or any reaction on the inside of your mouth or nose? Yes Did you need to seek medical attention at a hospital or doctor's office? Yes When did it last happen?      1970 If all above answers are "NO", may proceed with cephalosporin use.    Percocet [Oxycodone-Acetaminophen] Itching    Current Outpatient Medications  Medication Sig Dispense Refill   albuterol (VENTOLIN HFA) 108 (90 Base) MCG/ACT inhaler Inhale 2 puffs into the lungs every 6 (six) hours as needed for wheezing or shortness of breath.     amLODipine (NORVASC) 10 MG tablet Take 10 mg by mouth daily.     aspirin EC 81 MG tablet Take 81 mg by mouth daily.     atorvastatin (LIPITOR) 20 MG tablet Take 20 mg by mouth daily.     Azelastine-Fluticasone 137-50 MCG/ACT SUSP Place 1 spray into the nose in the morning and at bedtime.      budesonide-formoterol (SYMBICORT) 160-4.5 MCG/ACT inhaler Inhale 2 puffs into the lungs 2 (two) times daily.     cholecalciferol (VITAMIN D) 25 MCG (1000 UNIT) tablet Take 1,000 Units by mouth daily.     clopidogrel (PLAVIX) 75 MG tablet Take 1 tablet (75 mg total) by mouth daily. 30 tablet 11   cyclobenzaprine (FLEXERIL) 5 MG tablet Take 5 mg by mouth 2 (two) times daily as needed for muscle spasms.     DULoxetine (CYMBALTA) 60 MG capsule Take 1 capsule (60 mg total) by mouth daily. 30 capsule 1   guaiFENesin (MUCINEX) 600 MG 12 hr tablet Take 600 mg by mouth 2 (two) times daily as needed for cough or to loosen phlegm.     hydroxypropyl methylcellulose / hypromellose (ISOPTO TEARS / GONIOVISC) 2.5 % ophthalmic solution Place 1 drop into both eyes 3 (three) times daily as needed for dry eyes.     losartan (COZAAR) 25 MG tablet Take 25 mg by mouth daily.     Menthol, Topical Analgesic, (BIOFREEZE) 4 % GEL Apply 1 application. topically 2 (two) times daily as needed (pain.).     mineral oil-hydrophilic petrolatum (AQUAPHOR) ointment Apply 1 application. topically as needed for dry skin.     montelukast (SINGULAIR) 10 MG tablet Take 10 mg by mouth daily.     pantoprazole (PROTONIX) 40 MG tablet Take 40 mg by mouth in the morning and at bedtime.     pregabalin (LYRICA) 75 MG capsule Take 75 mg by mouth 2 (two) times daily.     senna-docusate (SENOKOT-S) 8.6-50 MG tablet Take 1 tablet by mouth at bedtime as needed for mild constipation.     Suvorexant (BELSOMRA) 20 MG TABS Take 20 mg by mouth at bedtime.      traZODone (DESYREL) 50 MG tablet Take 50 mg by mouth at bedtime.     Varenicline Tartrate, Starter, 0.5 MG X 11 & 1 MG X 42 TBPK Take 0.5-1 mg by mouth as directed. Take 1 tablet (0.5 mg) once daily x 3 days, take 1 tablet (0.5 mg) by mouth twice daily on days 4-7, increase to 1 tablet (1 mg) by mouth twice daily thereafter.     No current facility-administered medications for this visit.     Review of Systems  Constitutional:  Constitutional negative. HENT: HENT negative.  Eyes: Eyes negative.  Cardiovascular: Positive for leg swelling.  GI: Gastrointestinal negative.  Musculoskeletal: Positive for back pain, gait problem and leg pain.  Hematologic: Hematologic/lymphatic negative.  Psychiatric: Psychiatric negative.  Objective:  Objective   Vitals:   01/11/22 1135  BP: 118/79  Pulse: 85  Resp: 20  Temp: 97.7 F (36.5 C)  SpO2: 90%  Weight: 170 lb (77.1 kg)  Height: '5\' 2"'$  (1.575 m)   Body mass index is 31.09 kg/m.  Physical Exam HENT:     Head: Normocephalic.     Nose: Nose normal.     Mouth/Throat:     Mouth: Mucous membranes are moist.  Eyes:     Pupils: Pupils are equal, round, and reactive to light.  Cardiovascular:     Rate and Rhythm: Normal rate.     Pulses:          Femoral pulses are 2+ on the right side and 2+ on the left side.      Popliteal pulses are 0 on the right side and 0 on the left side.  Pulmonary:     Effort: Pulmonary effort is normal.  Abdominal:     General: Abdomen is flat.     Palpations: Abdomen is soft.  Skin:    General: Skin is warm and dry.     Capillary Refill: Capillary refill takes less than 2 seconds.  Neurological:     General: No focal deficit present.     Mental Status: She is alert.  Psychiatric:        Mood and Affect: Mood normal.        Behavior: Behavior normal.        Thought Content: Thought content normal.     Data: ABI Findings:  +---------+------------------+-----+----------+--------+  Right    Rt Pressure (mmHg)IndexWaveform  Comment   +---------+------------------+-----+----------+--------+  Brachial 140                                        +---------+------------------+-----+----------+--------+  PTA      104               0.74 biphasic            +---------+------------------+-----+----------+--------+  DP       101               0.72 monophasic           +---------+------------------+-----+----------+--------+  Great Toe67                0.48 Abnormal            +---------+------------------+-----+----------+--------+   +---------+------------------+-----+----------+-------+  Left     Lt Pressure (mmHg)IndexWaveform  Comment  +---------+------------------+-----+----------+-------+  Brachial 133                                       +---------+------------------+-----+----------+-------+  PTA      90                0.64 monophasic         +---------+------------------+-----+----------+-------+  DP       84                0.60 monophasic         +---------+------------------+-----+----------+-------+  Great Toe66                0.47                    +---------+------------------+-----+----------+-------+   +-------+-----------+-----------+------------+------------+  ABI/TBIToday's ABIToday's TBIPrevious ABIPrevious TBI  +-------+-----------+-----------+------------+------------+  Right  0.74       0.48       0.50        0.09          +-------+-----------+-----------+------------+------------+  Left   0.64       0.47       0.54        0.21          +-------+-----------+-----------+------------+------------+           Bilateral ABIs and TBIs appear increased.     Summary:  Right: Resting right ankle-brachial index indicates moderate right lower  extremity arterial disease. The right toe-brachial index is abnormal.   Left: Resting left ankle-brachial index indicates moderate left lower  extremity arterial disease. The left toe-brachial index is abnormal.       Assessment/Plan:    71 year old female status post bilateral external iliac artery stenting for rest pain which is now resolved and her ABIs and toe pressures have significantly improved.  She has also quit smoking which we congratulated her on today.  She has really recovered well after stenting although she does  have back pain radiating down both legs this does not appear to be vascular in nature and I will have her follow-up in 6 months with repeat duplex and ABIs.     Waynetta Sandy MD [facility]

## 2022-01-13 ENCOUNTER — Telehealth (HOSPITAL_COMMUNITY): Payer: Self-pay | Admitting: *Deleted

## 2022-01-13 ENCOUNTER — Other Ambulatory Visit: Payer: Self-pay

## 2022-01-13 ENCOUNTER — Ambulatory Visit (HOSPITAL_COMMUNITY)
Admission: RE | Admit: 2022-01-13 | Discharge: 2022-01-13 | Disposition: A | Discharge status: Home / Self Care | Payer: Medicare (Managed Care) | Source: Ambulatory Visit | Attending: Cardiology | Admitting: Cardiology

## 2022-01-13 DIAGNOSIS — I129 Hypertensive chronic kidney disease with stage 1 through stage 4 chronic kidney disease, or unspecified chronic kidney disease: Secondary | ICD-10-CM

## 2022-01-13 DIAGNOSIS — R0602 Shortness of breath: Secondary | ICD-10-CM | POA: Diagnosis not present

## 2022-01-13 DIAGNOSIS — R079 Chest pain, unspecified: Secondary | ICD-10-CM | POA: Diagnosis not present

## 2022-01-13 DIAGNOSIS — I739 Peripheral vascular disease, unspecified: Secondary | ICD-10-CM | POA: Diagnosis present

## 2022-01-13 DIAGNOSIS — I70223 Atherosclerosis of native arteries of extremities with rest pain, bilateral legs: Secondary | ICD-10-CM

## 2022-01-13 DIAGNOSIS — R0609 Other forms of dyspnea: Secondary | ICD-10-CM | POA: Diagnosis not present

## 2022-01-13 DIAGNOSIS — Z87891 Personal history of nicotine dependence: Secondary | ICD-10-CM | POA: Insufficient documentation

## 2022-01-13 DIAGNOSIS — E785 Hyperlipidemia, unspecified: Secondary | ICD-10-CM | POA: Diagnosis present

## 2022-01-13 DIAGNOSIS — N181 Chronic kidney disease, stage 1: Secondary | ICD-10-CM | POA: Diagnosis not present

## 2022-01-13 LAB — MYOCARDIAL PERFUSION IMAGING
LV dias vol: 69 mL (ref 46–106)
LV sys vol: 19 mL
Nuc Stress EF: 73 %
Peak HR: 99 {beats}/min
Rest HR: 88 {beats}/min
Rest Nuclear Isotope Dose: 10.2 mCi
SDS: 0
SRS: 0
SSS: 0
ST Depression (mm): 0 mm
Stress Nuclear Isotope Dose: 27.8 mCi
TID: 1

## 2022-01-13 MED ORDER — TECHNETIUM TC 99M TETROFOSMIN IV KIT
27.8000 | PACK | Freq: Once | INTRAVENOUS | Status: AC | PRN
  Administered 2022-01-13: 27.8 via INTRAVENOUS

## 2022-01-13 MED ORDER — REGADENOSON 0.4 MG/5ML IV SOLN
0.4000 mg | Freq: Once | INTRAVENOUS | Status: AC
  Administered 2022-01-13: 0.4 mg via INTRAVENOUS

## 2022-01-13 MED ORDER — TECHNETIUM TC 99M TETROFOSMIN IV KIT
10.2000 | PACK | Freq: Once | INTRAVENOUS | Status: AC | PRN
  Administered 2022-01-13: 10.2 via INTRAVENOUS

## 2022-01-13 NOTE — Telephone Encounter (Signed)
Close encounter 

## 2022-03-02 ENCOUNTER — Other Ambulatory Visit: Payer: Self-pay | Admitting: Vascular Surgery

## 2022-03-02 ENCOUNTER — Ambulatory Visit
Admission: RE | Admit: 2022-03-02 | Discharge: 2022-03-02 | Disposition: A | Discharge status: Home / Self Care | Payer: Medicare (Managed Care) | Source: Ambulatory Visit | Attending: Vascular Surgery | Admitting: Vascular Surgery

## 2022-03-02 DIAGNOSIS — S99921A Unspecified injury of right foot, initial encounter: Secondary | ICD-10-CM

## 2022-03-02 DIAGNOSIS — S99911A Unspecified injury of right ankle, initial encounter: Secondary | ICD-10-CM

## 2022-03-08 ENCOUNTER — Ambulatory Visit (INDEPENDENT_AMBULATORY_CARE_PROVIDER_SITE_OTHER): Payer: Medicare (Managed Care) | Admitting: Family

## 2022-03-08 ENCOUNTER — Ambulatory Visit (INDEPENDENT_AMBULATORY_CARE_PROVIDER_SITE_OTHER): Payer: Medicare (Managed Care)

## 2022-03-08 ENCOUNTER — Encounter: Payer: Self-pay | Admitting: Family

## 2022-03-08 DIAGNOSIS — M79671 Pain in right foot: Secondary | ICD-10-CM

## 2022-03-08 NOTE — Progress Notes (Signed)
Office Visit Note   Patient: Danielle Clay           Date of Birth: 10-Oct-1950           MRN: [id number] Visit Date: 03/08/2022              Requested by: Janifer Adie, MD [address] [place],  [place] [address] PCP: Janifer Adie, MD  Chief Complaint  Patient presents with   Right Foot - Injury      HPI: The patient is a 71 year old woman who is seen today for evaluation of an injury to her right foot.  She has been seen at an outside facility for the same radiographs were performed which were revealing for fractures through the second third and fourth metatarsals she was given a boot.  Today she is in slide on foam shoewear she states the boot was too big and ill fitting she does endorse pain with weightbearing  Assessment & Plan: Visit Diagnoses:  1. Right foot pain     Plan: Placed in a cam walker.  Follow-up in the office in 2 weeks with repeat radiographs.  Follow-Up Instructions: No follow-ups on file.   Ortho Exam  Patient is alert, oriented, no adenopathy, well-dressed, normal affect, normal respiratory effort. Right foot with minimal edema there is no deformity there is no erythema she does have tenderness over the dorsum of the midfoot.  Is distally neurovascularly intact.  Imaging: No results found. No images are attached to the encounter.  Labs: Lab Results  Component Value Date   HGBA1C 5.4 06/13/2016     Lab Results  Component Value Date   ALBUMIN 3.6 03/05/2019   ALBUMIN 4.2 04/13/2016    No results found for: "MG" No results found for: "VD25OH"  No results found for: "PREALBUMIN"    Latest Ref Rng & Units 06/20/2021    7:49 AM 03/05/2019   11:12 AM 04/13/2016    2:53 PM  CBC EXTENDED  WBC 4.0 - 10.5 K/uL  8.5  10.9   RBC 3.87 - 5.11 MIL/uL  4.79  5.10   Hemoglobin 12.0 - 15.0 g/dL 14.6  15.1  15.9   HCT 36.0 - 46.0 % 43.0  46.4  45.6   Platelets 150 - 400 K/uL  206  244   NEUT# 1.4 - 7.0 x10E3/uL   6.2   Lymph# 0.7 - 3.1  x10E3/uL   3.6      There is no height or weight on file to calculate BMI.  Orders:  Orders Placed This Encounter  Procedures   XR Foot Complete Right   No orders of the defined types were placed in this encounter.    Procedures: No procedures performed  Clinical Data: No additional findings.  ROS:  All other systems negative, except as noted in the HPI. Review of Systems  Objective: Vital Signs: There were no vitals taken for this visit.  Specialty Comments:  No specialty comments available.  PMFS History: Patient Active Problem List   Diagnosis Date Noted   Chronic pain syndrome 07/27/2021   Encntr for general adult medical exam w/o abnormal findings 07/27/2021   Essential (primary) hypertension 07/27/2021   History of cervical dysplasia 07/27/2021   Low back pain 07/27/2021   Vitamin D deficiency 07/27/2021   Dizziness 08/07/2017   Laryngopharyngeal reflux (LPR) 08/07/2017   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/2017   Eustachian tube dysfunction, bilateral 06/12/2017   Sensorineural hearing loss (SNHL), bilateral 06/12/2017   TMJ (sprain  of temporomandibular joint), initial encounter 06/12/2017   Body mass index (bmi) 33.0-33.9, adult 08/02/2016   Past Medical History:  Diagnosis Date   Allergy    seasonal   Arthritis    Bronchial asthma    Colon polyps    Constipation    COPD (chronic obstructive pulmonary disease) (HCC)    Early cataracts, bilateral    Fibromyalgia    Fracture    right index finger proximal phalanx fracture   GERD (gastroesophageal reflux disease)    Hypertension    Migraine    Osteoporosis    takes vitamins for this.   Wears glasses     Family History  Problem Relation Age of Onset   Diabetes Mother    Anemia Mother    HIV Sister    Heart Problems Brother    Asthma Brother    HIV Brother    HIV Brother    Esophageal cancer Maternal Uncle        can't remember when   Colon cancer Neg Hx    Colon polyps Neg Hx    Stomach  cancer Neg Hx    Rectal cancer Neg Hx     Past Surgical History:  Procedure Laterality Date   ABDOMINAL AORTOGRAM W/LOWER EXTREMITY N/A 06/20/2021   Procedure: ABDOMINAL AORTOGRAM W/LOWER EXTREMITY;  Surgeon: Waynetta Sandy, MD;  Location: [facility];  Service: Cardiovascular;  Laterality: N/A;   BREAST EXCISIONAL BIOPSY Right    CHOLECYSTECTOMY     CLOSED REDUCTION FINGER WITH PERCUTANEOUS PINNING Right 03/05/2019   Procedure: CLOSED REDUCTION FINGER WITH PERCUTANEOUS PINNING AND OPEN REDUCTION AND INTERNAL FIXTATION RIGHT INDEX FINGER;  Surgeon: Iran Planas, MD;  Location: [facility];  Service: Orthopedics;  Laterality: Right;  REGINAL WITH IV SEDATION   COLONOSCOPY     6 yrs ago with polyps   COLONOSCOPY W/ BIOPSIES AND POLYPECTOMY     CYST REMOVAL HAND     Under left breast   GALLBLADDER SURGERY     PERIPHERAL VASCULAR INTERVENTION Bilateral 06/20/2021   Procedure: PERIPHERAL VASCULAR INTERVENTION;  Surgeon: Waynetta Sandy, MD;  Location: [facility];  Service: Cardiovascular;  Laterality: Bilateral;  External Iliacs   POLYPECTOMY     TONSILLECTOMY AND ADENOIDECTOMY     TUBAL LIGATION     WISDOM TOOTH EXTRACTION     Social History   Occupational History   Occupation: N/A  Tobacco Use   Smoking status: Former    Packs/day: 1.00    Types: Cigarettes    Quit date: 12/29/2021    Years since quitting: 0.1   Smokeless tobacco: Never  Vaping Use   Vaping Use: Some days  Substance and Sexual Activity   Alcohol use: Yes    Comment: few times a week   Drug use: Yes    Types: Marijuana    Comment: occ   Sexual activity: Not Currently

## 2022-04-19 ENCOUNTER — Ambulatory Visit (INDEPENDENT_AMBULATORY_CARE_PROVIDER_SITE_OTHER): Payer: Medicare (Managed Care)

## 2022-04-19 ENCOUNTER — Encounter: Payer: Self-pay | Admitting: Family

## 2022-04-19 ENCOUNTER — Ambulatory Visit (INDEPENDENT_AMBULATORY_CARE_PROVIDER_SITE_OTHER): Payer: Medicare (Managed Care) | Admitting: Family

## 2022-04-19 DIAGNOSIS — M79671 Pain in right foot: Secondary | ICD-10-CM

## 2022-04-19 NOTE — Progress Notes (Signed)
Office Visit Note   Patient: Danielle Clay           Date of Birth: Jul 28, 1950           MRN: [id number] Visit Date: 04/19/2022              Requested by: Janifer Adie, MD No address on file PCP: Janifer Adie, MD  Chief Complaint  Patient presents with   Right Foot - Follow-up      HPI: The patient is a 72 year old woman seen today in follow-up.  She sustained fractures of the second third and fourth metatarsals and was placed in a cam walker.  She was last seen in the office on December 6.  She states she has been having a hard time tolerating her cam walker and has been walking in the home and crocs for shoewear.  She has minimal pain with her crocs.  Does have pain with barefoot ambulation.  Assessment & Plan: Visit Diagnoses:  1. Pain in right foot     Plan: Recommend stiff shoe wear this for the next 4 weeks.  If she fails to improve in the next 4 weeks she will follow-up in the office  Follow-Up Instructions: No follow-ups on file.   Ortho Exam  Patient is alert, oriented, no adenopathy, well-dressed, normal affect, normal respiratory effort. On examination of the right foot there is no edema no erythema she does have a palpable dorsalis pedis pulse she continues to have mild to moderate tenderness over the dorsum of the midfoot  Imaging: No results found. No images are attached to the encounter.  Labs: Lab Results  Component Value Date   HGBA1C 5.4 06/13/2016     Lab Results  Component Value Date   ALBUMIN 3.6 03/05/2019   ALBUMIN 4.2 04/13/2016    No results found for: "MG" No results found for: "VD25OH"  No results found for: "PREALBUMIN"    Latest Ref Rng & Units 06/20/2021    7:49 AM 03/05/2019   11:12 AM 04/13/2016    2:53 PM  CBC EXTENDED  WBC 4.0 - 10.5 K/uL  8.5  10.9   RBC 3.87 - 5.11 MIL/uL  4.79  5.10   Hemoglobin 12.0 - 15.0 g/dL 14.6  15.1  15.9   HCT 36.0 - 46.0 % 43.0  46.4  45.6   Platelets 150 - 400 K/uL  206  244    NEUT# 1.4 - 7.0 x10E3/uL   6.2   Lymph# 0.7 - 3.1 x10E3/uL   3.6      There is no height or weight on file to calculate BMI.  Orders:  Orders Placed This Encounter  Procedures   XR Foot Complete Right   No orders of the defined types were placed in this encounter.    Procedures: No procedures performed  Clinical Data: No additional findings.  ROS:  All other systems negative, except as noted in the HPI. Review of Systems  Objective: Vital Signs: There were no vitals taken for this visit.  Specialty Comments:  No specialty comments available.  PMFS History: Patient Active Problem List   Diagnosis Date Noted   Chronic pain syndrome 07/27/2021   Encntr for general adult medical exam w/o abnormal findings 07/27/2021   Essential (primary) hypertension 07/27/2021   History of cervical dysplasia 07/27/2021   Low back pain 07/27/2021   Vitamin D deficiency 07/27/2021   Dizziness 08/07/2017   Laryngopharyngeal reflux (LPR) 08/07/2017   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/2017  Eustachian tube dysfunction, bilateral 06/12/2017   Sensorineural hearing loss (SNHL), bilateral 06/12/2017   TMJ (sprain of temporomandibular joint), initial encounter 06/12/2017   Body mass index (bmi) 33.0-33.9, adult 08/02/2016   Past Medical History:  Diagnosis Date   Allergy    seasonal   Arthritis    Bronchial asthma    Colon polyps    Constipation    COPD (chronic obstructive pulmonary disease) (HCC)    Early cataracts, bilateral    Fibromyalgia    Fracture    right index finger proximal phalanx fracture   GERD (gastroesophageal reflux disease)    Hypertension    Migraine    Osteoporosis    takes vitamins for this.   Wears glasses     Family History  Problem Relation Age of Onset   Diabetes Mother    Anemia Mother    HIV Sister    Heart Problems Brother    Asthma Brother    HIV Brother    HIV Brother    Esophageal cancer Maternal Uncle        can't remember when   Colon  cancer Neg Hx    Colon polyps Neg Hx    Stomach cancer Neg Hx    Rectal cancer Neg Hx     Past Surgical History:  Procedure Laterality Date   ABDOMINAL AORTOGRAM W/LOWER EXTREMITY N/A 06/20/2021   Procedure: ABDOMINAL AORTOGRAM W/LOWER EXTREMITY;  Surgeon: Waynetta Sandy, MD;  Location: [facility];  Service: Cardiovascular;  Laterality: N/A;   BREAST EXCISIONAL BIOPSY Right    CHOLECYSTECTOMY     CLOSED REDUCTION FINGER WITH PERCUTANEOUS PINNING Right 03/05/2019   Procedure: CLOSED REDUCTION FINGER WITH PERCUTANEOUS PINNING AND OPEN REDUCTION AND INTERNAL FIXTATION RIGHT INDEX FINGER;  Surgeon: Iran Planas, MD;  Location: [facility];  Service: Orthopedics;  Laterality: Right;  REGINAL WITH IV SEDATION   COLONOSCOPY     6 yrs ago with polyps   COLONOSCOPY W/ BIOPSIES AND POLYPECTOMY     CYST REMOVAL HAND     Under left breast   GALLBLADDER SURGERY     PERIPHERAL VASCULAR INTERVENTION Bilateral 06/20/2021   Procedure: PERIPHERAL VASCULAR INTERVENTION;  Surgeon: Waynetta Sandy, MD;  Location: [facility];  Service: Cardiovascular;  Laterality: Bilateral;  External Iliacs   POLYPECTOMY     TONSILLECTOMY AND ADENOIDECTOMY     TUBAL LIGATION     WISDOM TOOTH EXTRACTION     Social History   Occupational History   Occupation: N/A  Tobacco Use   Smoking status: Former    Packs/day: 1.00    Types: Cigarettes    Quit date: 12/29/2021    Years since quitting: 0.3   Smokeless tobacco: Never  Vaping Use   Vaping Use: Some days  Substance and Sexual Activity   Alcohol use: Yes    Comment: few times a week   Drug use: Yes    Types: Marijuana    Comment: occ   Sexual activity: Not Currently

## 2022-05-03 ENCOUNTER — Ambulatory Visit
Admission: RE | Admit: 2022-05-03 | Discharge: 2022-05-03 | Disposition: A | Discharge status: Home / Self Care | Payer: No Typology Code available for payment source | Source: Ambulatory Visit | Attending: Vascular Surgery | Admitting: Vascular Surgery

## 2022-05-03 ENCOUNTER — Other Ambulatory Visit: Payer: Self-pay | Admitting: Vascular Surgery

## 2022-05-03 DIAGNOSIS — R059 Cough, unspecified: Secondary | ICD-10-CM

## 2022-05-25 ENCOUNTER — Ambulatory Visit: Payer: Medicare (Managed Care)

## 2022-07-15 ENCOUNTER — Encounter (HOSPITAL_BASED_OUTPATIENT_CLINIC_OR_DEPARTMENT_OTHER): Payer: Self-pay | Admitting: *Deleted

## 2022-07-15 ENCOUNTER — Emergency Department (HOSPITAL_BASED_OUTPATIENT_CLINIC_OR_DEPARTMENT_OTHER)
Admission: EM | Admit: 2022-07-15 | Discharge: 2022-07-15 | Disposition: A | Discharge status: Home / Self Care | Payer: Medicare (Managed Care) | Attending: Emergency Medicine | Admitting: Emergency Medicine

## 2022-07-15 ENCOUNTER — Other Ambulatory Visit: Payer: Self-pay

## 2022-07-15 DIAGNOSIS — Z7982 Long term (current) use of aspirin: Secondary | ICD-10-CM | POA: Diagnosis not present

## 2022-07-15 DIAGNOSIS — R899 Unspecified abnormal finding in specimens from other organs, systems and tissues: Secondary | ICD-10-CM | POA: Insufficient documentation

## 2022-07-15 LAB — BASIC METABOLIC PANEL
Anion gap: 8 (ref 5–15)
BUN: 13 mg/dL (ref 8–23)
CO2: 28 mmol/L (ref 22–32)
Calcium: 9.5 mg/dL (ref 8.9–10.3)
Chloride: 101 mmol/L (ref 98–111)
Creatinine, Ser: 0.7 mg/dL (ref 0.44–1.00)
GFR, Estimated: >60 mL/min (ref >60)
Glucose, Bld: 119 mg/dL — ABNORMAL HIGH (ref 70–99)
Potassium: 3.7 mmol/L (ref 3.5–5.1)
Sodium: 137 mmol/L (ref 135–145)

## 2022-07-15 LAB — CBC WITH DIFFERENTIAL/PLATELET
Abs Immature Granulocytes: 0.06 10*3/uL (ref 0.00–0.07)
Basophils Absolute: 0 10*3/uL (ref 0.0–0.1)
Basophils Relative: 0 %
Eosinophils Absolute: 0.4 10*3/uL (ref 0.0–0.5)
Eosinophils Relative: 5 %
HCT: 41.7 % (ref 36.0–46.0)
Hemoglobin: 13.6 g/dL (ref 12.0–15.0)
Immature Granulocytes: 1 %
Lymphocytes Relative: 27 %
Lymphs Abs: 1.9 10*3/uL (ref 0.7–4.0)
MCH: 31.1 pg (ref 26.0–34.0)
MCHC: 32.6 g/dL (ref 30.0–36.0)
MCV: 95.2 fL (ref 80.0–100.0)
Monocytes Absolute: 0.8 10*3/uL (ref 0.1–1.0)
Monocytes Relative: 11 %
Neutro Abs: 4.1 10*3/uL (ref 1.7–7.7)
Neutrophils Relative %: 56 %
Platelets: 228 10*3/uL (ref 150–400)
RBC: 4.38 MIL/uL (ref 3.87–5.11)
RDW: 14 % (ref 11.5–15.5)
WBC: 7.3 10*3/uL (ref 4.0–10.5)
nRBC: 0 % (ref 0.0–0.2)

## 2022-07-15 NOTE — ED Triage Notes (Signed)
Pt had routine blood work for annual exam and was called today and told to come in due to potassium 6.8.

## 2022-07-15 NOTE — ED Provider Notes (Addendum)
[facility] Provider Note   CSN: [id number] Arrival date & time: 07/15/22  1533     History  Chief Complaint  Patient presents with   Abnormal Lab    K 6.8    Danielle Clay is a 72 y.o. female.  Patient here for recheck of her potassium.  She had a potassium of 6.8 down on her annual labs yesterday.  She has no symptoms.  The history is provided by the patient.       Home Medications Prior to Admission medications   Medication Sig Start Date End Date Taking? Authorizing Provider  albuterol (VENTOLIN HFA) 108 (90 Base) MCG/ACT inhaler Inhale 2 puffs into the lungs every 6 (six) hours as needed for wheezing or shortness of breath.    [provider]  amLODipine (NORVASC) 10 MG tablet Take 10 mg by mouth daily.    [provider]  aspirin EC 81 MG tablet Take 81 mg by mouth daily.    [provider]  atorvastatin (LIPITOR) 20 MG tablet Take 20 mg by mouth daily.    [provider]  Azelastine-Fluticasone 137-50 MCG/ACT SUSP Place 1 spray into the nose in the morning and at bedtime.    [provider]  budesonide-formoterol (SYMBICORT) 160-4.5 MCG/ACT inhaler Inhale 2 puffs into the lungs 2 (two) times daily.    [provider]  cholecalciferol (VITAMIN D) 25 MCG (1000 UNIT) tablet Take 1,000 Units by mouth daily.    [provider]  cyclobenzaprine (FLEXERIL) 5 MG tablet Take 5 mg by mouth 2 (two) times daily as needed for muscle spasms.    [provider]  DULoxetine (CYMBALTA) 60 MG capsule Take 1 capsule (60 mg total) by mouth daily. 01/10/17   Marcello Fennel, MD  guaiFENesin (MUCINEX) 600 MG 12 hr tablet Take 600 mg by mouth 2 (two) times daily as needed for cough or to loosen phlegm.    [provider]  hydroxypropyl methylcellulose / hypromellose (ISOPTO TEARS / GONIOVISC) 2.5 % ophthalmic solution Place 1 drop into both eyes 3 (three) times daily  as needed for dry eyes.    [provider]  losartan (COZAAR) 25 MG tablet Take 25 mg by mouth daily.    [provider]  Menthol, Topical Analgesic, (BIOFREEZE) 4 % GEL Apply 1 application. topically 2 (two) times daily as needed (pain.).    [provider]  mineral oil-hydrophilic petrolatum (AQUAPHOR) ointment Apply 1 application. topically as needed for dry skin.    [provider]  montelukast (SINGULAIR) 10 MG tablet Take 10 mg by mouth daily.    [provider]  pantoprazole (PROTONIX) 40 MG tablet Take 40 mg by mouth in the morning and at bedtime.    [provider]  pregabalin (LYRICA) 75 MG capsule Take 75 mg by mouth 2 (two) times daily.    [provider]  senna-docusate (SENOKOT-S) 8.6-50 MG tablet Take 1 tablet by mouth at bedtime as needed for mild constipation.    [provider]  Suvorexant (BELSOMRA) 20 MG TABS Take 20 mg by mouth at bedtime.     [provider]  traZODone (DESYREL) 50 MG tablet Take 50 mg by mouth at bedtime.    [provider]  Varenicline Tartrate, Starter, 0.5 MG X 11 & 1 MG X 42 TBPK Take 0.5-1 mg by mouth as directed. Take 1 tablet (0.5 mg) once daily x 3 days, take 1 tablet (0.5  mg) by mouth twice daily on days 4-7, increase to 1 tablet (1 mg) by mouth twice daily thereafter.    [provider]      Allergies    Penicillins and Percocet [oxycodone-acetaminophen]    Review of Systems   Review of Systems  Physical Exam Updated Vital Signs BP 118/69   Pulse 85   Temp 98.2 F (36.8 C) (Oral)   Resp 20   SpO2 92%  Physical Exam Neurological:     Mental Status: She is alert.     ED Results / Procedures / Treatments   Labs (all labs ordered are listed, but only abnormal results are displayed) Labs Reviewed  BASIC METABOLIC PANEL - Abnormal; Notable for the following components:      Result Value   Glucose, Bld 119 (*)    All other components  within normal limits  CBC WITH DIFFERENTIAL/PLATELET    EKG EKG Interpretation  Date/Time:  Saturday July 15 2022 16:31:45 EDT Ventricular Rate:  87 PR Interval:  174 QRS Duration: 82 QT Interval:  354 QTC Calculation: 425 R Axis:   67 Text Interpretation: Normal sinus rhythm Low voltage QRS Borderline ECG When compared with ECG of 20-Jun-2021 07:28, PREVIOUS ECG IS PRESENT Confirmed by Virgina Norfolk ([id number]) on 07/15/2022 4:32:43 PM  Radiology No results found.  Procedures Procedures    Medications Ordered in ED Medications - No data to display  ED Course/ Medical Decision Making/ A&P                             Medical Decision Making Amount and/or Complexity of Data Reviewed Labs: ordered.   Pablo Ledger recheck of her potassium is normal.  3.7.  Overall I suspect hemolysius yesterday.  Discharge.        Final Clinical Impression(s) / ED Diagnoses Final diagnoses:  Abnormal laboratory test    Rx / DC Orders ED Discharge Orders     None         Virgina Norfolk, DO 07/15/22 1635    Virgina Norfolk, DO 07/15/22 1635

## 2022-07-15 NOTE — Discharge Instructions (Signed)
Your potassium is normal today.  I suspect this was a lab error yesterday.

## 2022-07-19 ENCOUNTER — Ambulatory Visit: Payer: Medicare (Managed Care) | Admitting: Family

## 2022-07-19 ENCOUNTER — Encounter: Payer: Self-pay | Admitting: Family

## 2022-07-19 DIAGNOSIS — S92321D Displaced fracture of second metatarsal bone, right foot, subsequent encounter for fracture with routine healing: Secondary | ICD-10-CM

## 2022-07-19 DIAGNOSIS — M79671 Pain in right foot: Secondary | ICD-10-CM

## 2022-07-19 NOTE — Progress Notes (Signed)
Office Visit Note   Patient: Danielle Clay           Date of Birth: [birth year]           MRN: [id number] Visit Date: 07/19/2022              Requested by: [facility] [address] [place],  [place] [address] PCP: Jethro Bastos, MD  Chief Complaint  Patient presents with   Right Foot - Follow-up      HPI: The patient is a 72 year old woman who presents today in follow-up.  She continues to have issues with pain and swelling in the right foot.  Pain primarily over the dorsum of her foot and anterior ankle.  She states the swelling comes and goes especially the longer that she is on her feet.  No concerns of swelling today.  She has been using elevation and rest to treat her swelling.  Of note has had fractures of the second third and fourth metatarsals of the right foot about 5 months ago  Today is in crocs.  Assessment & Plan: Visit Diagnoses: No diagnosis found.  Plan: Again discussed using stiff soled walking shoes or a cork orthotic.  Offered use of her cam walker or postop shoe for the next 4 weeks.  Follow-Up Instructions: No follow-ups on file.   Ortho Exam  Patient is alert, oriented, no adenopathy, well-dressed, normal affect, normal respiratory effort. On examination of the right foot there is no edema today there is no deformity erythema ecchymosis noted.  There is minimal tenderness of the dorsum of her foot this is diffuse.  She does have palpable dorsalis pedis pulse.  Imaging: No results found. No images are attached to the encounter.  Labs: Lab Results  Component Value Date   HGBA1C 5.4 06/13/2016     Lab Results  Component Value Date   ALBUMIN 3.6 03/05/2019   ALBUMIN 4.2 04/13/2016    No results found for: "MG" No results found for: "VD25OH"  No results found for: "PREALBUMIN"    Latest Ref Rng & Units 07/15/2022    4:03 PM 06/20/2021    7:49 AM 03/05/2019   11:12 AM  CBC EXTENDED  WBC 4.0 - 10.5  K/uL 7.3   8.5   RBC 3.87 - 5.11 MIL/uL 4.38   4.79   Hemoglobin 12.0 - 15.0 g/dL 36.6  44.0  34.7   HCT 36.0 - 46.0 % 41.7  43.0  46.4   Platelets 150 - 400 K/uL 228   206   NEUT# 1.7 - 7.7 K/uL 4.1     Lymph# 0.7 - 4.0 K/uL 1.9        There is no height or weight on file to calculate BMI.  Orders:  No orders of the defined types were placed in this encounter.  No orders of the defined types were placed in this encounter.    Procedures: No procedures performed  Clinical Data: No additional findings.  ROS:  All other systems negative, except as noted in the HPI. Review of Systems  Objective: Vital Signs: There were no vitals taken for this visit.  Specialty Comments:  No specialty comments available.  PMFS History: Patient Active Problem List   Diagnosis Date Noted   Chronic pain syndrome 07/27/2021   Encntr for general adult medical exam w/o abnormal findings 07/27/2021   Essential (primary) hypertension 07/27/2021   History of cervical dysplasia 07/27/2021   Low back pain 07/27/2021  Vitamin D deficiency 07/27/2021   Dizziness 08/07/2017   Laryngopharyngeal reflux (LPR) 08/07/2017   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/2017   Eustachian tube dysfunction, bilateral 06/12/2017   Sensorineural hearing loss (SNHL), bilateral 06/12/2017   TMJ (sprain of temporomandibular joint), initial encounter 06/12/2017   Body mass index (bmi) 33.0-33.9, adult 08/02/2016   Past Medical History:  Diagnosis Date   Allergy    seasonal   Arthritis    Bronchial asthma    Colon polyps    Constipation    COPD (chronic obstructive pulmonary disease)    Early cataracts, bilateral    Fibromyalgia    Fracture    right index finger proximal phalanx fracture   GERD (gastroesophageal reflux disease)    Hypertension    Migraine    Osteoporosis    takes vitamins for this.   Wears glasses     Family History  Problem Relation Age of Onset   Diabetes Mother    Anemia Mother    HIV  Sister    Heart Problems Brother    Asthma Brother    HIV Brother    HIV Brother    Esophageal cancer Maternal Uncle        can't remember when   Colon cancer Neg Hx    Colon polyps Neg Hx    Stomach cancer Neg Hx    Rectal cancer Neg Hx     Past Surgical History:  Procedure Laterality Date   ABDOMINAL AORTOGRAM W/LOWER EXTREMITY N/A 06/20/2021   Procedure: ABDOMINAL AORTOGRAM W/LOWER EXTREMITY;  Surgeon: Maeola Harman, MD;  Location: [facility];  Service: Cardiovascular;  Laterality: N/A;   BREAST EXCISIONAL BIOPSY Right    CHOLECYSTECTOMY     CLOSED REDUCTION FINGER WITH PERCUTANEOUS PINNING Right 03/05/2019   Procedure: CLOSED REDUCTION FINGER WITH PERCUTANEOUS PINNING AND OPEN REDUCTION AND INTERNAL FIXTATION RIGHT INDEX FINGER;  Surgeon: Bradly Bienenstock, MD;  Location: [facility];  Service: Orthopedics;  Laterality: Right;  REGINAL WITH IV SEDATION   COLONOSCOPY     6 yrs ago with polyps   COLONOSCOPY W/ BIOPSIES AND POLYPECTOMY     CYST REMOVAL HAND     Under left breast   GALLBLADDER SURGERY     PERIPHERAL VASCULAR INTERVENTION Bilateral 06/20/2021   Procedure: PERIPHERAL VASCULAR INTERVENTION;  Surgeon: Maeola Harman, MD;  Location: [facility];  Service: Cardiovascular;  Laterality: Bilateral;  External Iliacs   POLYPECTOMY     TONSILLECTOMY AND ADENOIDECTOMY     TUBAL LIGATION     WISDOM TOOTH EXTRACTION     Social History   Occupational History   Occupation: N/A  Tobacco Use   Smoking status: Former    Packs/day: 1    Types: Cigarettes    Quit date: 12/29/2021    Years since quitting: 0.5   Smokeless tobacco: Never  Vaping Use   Vaping Use: Some days  Substance and Sexual Activity   Alcohol use: Yes    Comment: few times a week   Drug use: Yes    Types: Marijuana    Comment: occ   Sexual activity: Not Currently

## 2022-07-27 ENCOUNTER — Other Ambulatory Visit: Payer: Self-pay | Admitting: Vascular Surgery

## 2022-07-27 DIAGNOSIS — F17210 Nicotine dependence, cigarettes, uncomplicated: Secondary | ICD-10-CM

## 2022-07-27 DIAGNOSIS — Z122 Encounter for screening for malignant neoplasm of respiratory organs: Secondary | ICD-10-CM

## 2022-08-23 ENCOUNTER — Encounter (HOSPITAL_COMMUNITY): Payer: Medicare (Managed Care)

## 2022-08-23 ENCOUNTER — Ambulatory Visit: Payer: Medicare (Managed Care)

## 2022-10-04 ENCOUNTER — Ambulatory Visit (INDEPENDENT_AMBULATORY_CARE_PROVIDER_SITE_OTHER): Payer: Medicare (Managed Care) | Admitting: Physician Assistant

## 2022-10-04 ENCOUNTER — Ambulatory Visit (HOSPITAL_COMMUNITY)
Admission: RE | Admit: 2022-10-04 | Discharge: 2022-10-04 | Disposition: A | Discharge status: Home / Self Care | Payer: Medicare (Managed Care) | Source: Ambulatory Visit | Attending: Vascular Surgery | Admitting: Vascular Surgery

## 2022-10-04 ENCOUNTER — Ambulatory Visit (INDEPENDENT_AMBULATORY_CARE_PROVIDER_SITE_OTHER)
Admission: RE | Admit: 2022-10-04 | Discharge: 2022-10-04 | Disposition: A | Discharge status: Home / Self Care | Payer: Medicare (Managed Care) | Source: Ambulatory Visit | Attending: Vascular Surgery

## 2022-10-04 VITALS — BP 109/66 | HR 96 | Temp 97.7°F | Resp 18 | Ht 64.0 in | Wt 168.4 lb

## 2022-10-04 DIAGNOSIS — I70223 Atherosclerosis of native arteries of extremities with rest pain, bilateral legs: Secondary | ICD-10-CM | POA: Diagnosis present

## 2022-10-04 DIAGNOSIS — G8929 Other chronic pain: Secondary | ICD-10-CM

## 2022-10-04 DIAGNOSIS — I739 Peripheral vascular disease, unspecified: Secondary | ICD-10-CM

## 2022-10-04 DIAGNOSIS — M5441 Lumbago with sciatica, right side: Secondary | ICD-10-CM

## 2022-10-04 DIAGNOSIS — M5442 Lumbago with sciatica, left side: Secondary | ICD-10-CM | POA: Diagnosis not present

## 2022-10-04 LAB — VAS US ABI WITH/WO TBI
Left ABI: 0.7
Right ABI: 0.73

## 2022-10-04 NOTE — Progress Notes (Signed)
Office Note   History of Present Illness   Danielle Clay is a 72 y.o. (May 14, 1950) female who presents for surveillance of PAD.  She has a history of bilateral lower extremity rest pain, treated with bilateral external iliac artery stenting on 06/20/2021 by Dr. Randie Heinz.  Her rest pain resolved after stenting.  The patient returns today for follow up.  She denies any rest pain, claudication, tissue loss of the lower extremities.  She does have chronic lower back pain with radiating pain down both of her legs.  She has previously seen an orthopedic doctor in [place] and had back injections, but denies long-term benefit from injections.  She would like to avoid back surgery.  She thinks she has seen a pain management clinic in the past, but does not see a pain doctor now.  She takes Flexeril for her back pain, but this does not help.  She also takes Lyrica for her fibromyalgia.  Current Outpatient Medications  Medication Sig Dispense Refill   albuterol (VENTOLIN HFA) 108 (90 Base) MCG/ACT inhaler Inhale 2 puffs into the lungs every 6 (six) hours as needed for wheezing or shortness of breath.     amLODipine (NORVASC) 10 MG tablet Take 10 mg by mouth daily.     aspirin EC 81 MG tablet Take 81 mg by mouth daily.     atorvastatin (LIPITOR) 20 MG tablet Take 20 mg by mouth daily.     Azelastine-Fluticasone 137-50 MCG/ACT SUSP Place 1 spray into the nose in the morning and at bedtime.     budesonide-formoterol (SYMBICORT) 160-4.5 MCG/ACT inhaler Inhale 2 puffs into the lungs 2 (two) times daily.     cholecalciferol (VITAMIN D) 25 MCG (1000 UNIT) tablet Take 1,000 Units by mouth daily.     cyclobenzaprine (FLEXERIL) 5 MG tablet Take 5 mg by mouth 2 (two) times daily as needed for muscle spasms.     DULoxetine (CYMBALTA) 60 MG capsule Take 1 capsule (60 mg total) by mouth daily. 30 capsule 1   guaiFENesin (MUCINEX) 600 MG 12 hr tablet Take 600 mg by mouth 2 (two) times daily as needed for cough or to  loosen phlegm.     hydroxypropyl methylcellulose / hypromellose (ISOPTO TEARS / GONIOVISC) 2.5 % ophthalmic solution Place 1 drop into both eyes 3 (three) times daily as needed for dry eyes.     losartan (COZAAR) 25 MG tablet Take 25 mg by mouth daily.     Menthol, Topical Analgesic, (BIOFREEZE) 4 % GEL Apply 1 application. topically 2 (two) times daily as needed (pain.).     mineral oil-hydrophilic petrolatum (AQUAPHOR) ointment Apply 1 application. topically as needed for dry skin.     montelukast (SINGULAIR) 10 MG tablet Take 10 mg by mouth daily.     pantoprazole (PROTONIX) 40 MG tablet Take 40 mg by mouth in the morning and at bedtime.     pregabalin (LYRICA) 75 MG capsule Take 75 mg by mouth 2 (two) times daily.     senna-docusate (SENOKOT-S) 8.6-50 MG tablet Take 1 tablet by mouth at bedtime as needed for mild constipation.     Suvorexant (BELSOMRA) 20 MG TABS Take 20 mg by mouth at bedtime.      traZODone (DESYREL) 50 MG tablet Take 50 mg by mouth at bedtime.     Varenicline Tartrate, Starter, 0.5 MG X 11 & 1 MG X 42 TBPK Take 0.5-1 mg by mouth as directed. Take 1 tablet (0.5 mg) once daily x 3  days, take 1 tablet (0.5 mg) by mouth twice daily on days 4-7, increase to 1 tablet (1 mg) by mouth twice daily thereafter.     No current facility-administered medications for this visit.    REVIEW OF SYSTEMS (negative unless checked):   Cardiac:  []  Chest pain or chest pressure? []  Shortness of breath upon activity? []  Shortness of breath when lying flat? []  Irregular heart rhythm?  Vascular:  []  Pain in calf, thigh, or hip brought on by walking? []  Pain in feet at night that wakes you up from your sleep? []  Blood clot in your veins? []  Leg swelling?  Pulmonary:  []  Oxygen at home? []  Productive cough? []  Wheezing?  Neurologic:  []  Sudden weakness in arms or legs? []  Sudden numbness in arms or legs? []  Sudden onset of difficult speaking or slurred speech? []  Temporary loss of  vision in one eye? []  Problems with dizziness?  Gastrointestinal:  []  Blood in stool? []  Vomited blood?  Genitourinary:  []  Burning when urinating? []  Blood in urine?  Psychiatric:  []  Major depression  Hematologic:  []  Bleeding problems? []  Problems with blood clotting?  Dermatologic:  []  Rashes or ulcers?  Constitutional:  []  Fever or chills?  Ear/Nose/Throat:  []  Change in hearing? []  Nose bleeds? []  Sore throat?  Musculoskeletal:  [x]  Back pain? []  Joint pain? []  Muscle pain?   Physical Examination   Vitals:   10/04/22 0949  BP: 109/66  Pulse: 96  Resp: 18  Temp: 97.7 F (36.5 C)  TempSrc: Temporal  SpO2: 94%  Weight: 168 lb 6.4 oz (76.4 kg)  Height: 5\' 4"  (1.626 m)   Body mass index is 28.91 kg/m.  General:  WDWN in NAD; vital signs documented above Gait: Not observed HENT: WNL, normocephalic Pulmonary: normal non-labored breathing , without rales, rhonchi,  wheezing Cardiac: regular Abdomen: soft, NT, no masses Skin: without rashes Vascular Exam/Pulses: Brisk DP/PT Doppler signals bilaterally Extremities: without ischemic changes, without gangrene , without cellulitis; without open wounds;  Musculoskeletal: no muscle wasting or atrophy  Neurologic: A&O X 3;  No focal weakness or paresthesias are detected Psychiatric:  The pt has Normal affect.  Non-Invasive Vascular imaging   ABI (10/04/2022) R:  ABI: 0.73 (0.74),  PT: mono DP: mono TBI:  0.41 L:  ABI: 0.7 (0.64),  PT: mono DP: bi TBI: 0.56  Aortoiliac Duplex (10/04/2022) Patent external iliac artery stents bilaterally without stenosis   Medical Decision Making   Danielle Clay is a 72 y.o. female who presents for surveillance of PAD  Based on the patient's vascular studies, her ABIs are essentially unchanged from her last visit.  Her right ABI 0.73 and left ABI 0.7. Arterial duplex demonstrates patent bilateral external iliac artery stents without stenosis She denies any  claudication, rest pain, or tissue loss.  She still has chronic lower back pain that radiates into her legs.  She takes Flexeril currently but reports that this does not help. She reports previously seeing an orthopedic provider and pain management back in [place].  She has gotten back injections before, which did not help with her pain.  I will refer her to a pain management clinic nearby to help with her chronic back pain.  She does not want back surgery. She can follow-up with our office in 1 year with repeat ABIs and aortoiliac duplex   Loel Dubonnet PA-C [facility] Office: [phone]  Clinic MD: Randie Heinz

## 2022-10-09 ENCOUNTER — Inpatient Hospital Stay (HOSPITAL_COMMUNITY)
Admission: EM | Admit: 2022-10-09 | Discharge: 2022-10-13 | DRG: 872 | Disposition: A | Discharge status: Home / Self Care | Payer: Medicare (Managed Care) | Attending: Internal Medicine | Admitting: Internal Medicine

## 2022-10-09 ENCOUNTER — Other Ambulatory Visit: Payer: Self-pay

## 2022-10-09 ENCOUNTER — Emergency Department (HOSPITAL_COMMUNITY): Payer: Medicare (Managed Care)

## 2022-10-09 ENCOUNTER — Encounter (HOSPITAL_COMMUNITY): Payer: Self-pay | Admitting: Internal Medicine

## 2022-10-09 DIAGNOSIS — Z7982 Long term (current) use of aspirin: Secondary | ICD-10-CM

## 2022-10-09 DIAGNOSIS — M810 Age-related osteoporosis without current pathological fracture: Secondary | ICD-10-CM | POA: Diagnosis present

## 2022-10-09 DIAGNOSIS — N200 Calculus of kidney: Secondary | ICD-10-CM | POA: Diagnosis not present

## 2022-10-09 DIAGNOSIS — Z7951 Long term (current) use of inhaled steroids: Secondary | ICD-10-CM

## 2022-10-09 DIAGNOSIS — F17210 Nicotine dependence, cigarettes, uncomplicated: Secondary | ICD-10-CM | POA: Diagnosis present

## 2022-10-09 DIAGNOSIS — A09 Infectious gastroenteritis and colitis, unspecified: Secondary | ICD-10-CM | POA: Diagnosis not present

## 2022-10-09 DIAGNOSIS — K529 Noninfective gastroenteritis and colitis, unspecified: Principal | ICD-10-CM | POA: Diagnosis present

## 2022-10-09 DIAGNOSIS — E876 Hypokalemia: Secondary | ICD-10-CM | POA: Diagnosis present

## 2022-10-09 DIAGNOSIS — F39 Unspecified mood [affective] disorder: Secondary | ICD-10-CM | POA: Diagnosis not present

## 2022-10-09 DIAGNOSIS — M797 Fibromyalgia: Secondary | ICD-10-CM | POA: Diagnosis not present

## 2022-10-09 DIAGNOSIS — E872 Acidosis, unspecified: Secondary | ICD-10-CM | POA: Diagnosis not present

## 2022-10-09 DIAGNOSIS — J449 Chronic obstructive pulmonary disease, unspecified: Secondary | ICD-10-CM | POA: Diagnosis not present

## 2022-10-09 DIAGNOSIS — R6520 Severe sepsis without septic shock: Secondary | ICD-10-CM | POA: Diagnosis present

## 2022-10-09 DIAGNOSIS — K7460 Unspecified cirrhosis of liver: Secondary | ICD-10-CM | POA: Diagnosis present

## 2022-10-09 DIAGNOSIS — Z800 Family history of malignant neoplasm of digestive organs: Secondary | ICD-10-CM | POA: Diagnosis not present

## 2022-10-09 DIAGNOSIS — Z880 Allergy status to penicillin: Secondary | ICD-10-CM

## 2022-10-09 DIAGNOSIS — I10 Essential (primary) hypertension: Secondary | ICD-10-CM | POA: Diagnosis present

## 2022-10-09 DIAGNOSIS — A419 Sepsis, unspecified organism: Secondary | ICD-10-CM | POA: Diagnosis not present

## 2022-10-09 DIAGNOSIS — G4700 Insomnia, unspecified: Secondary | ICD-10-CM | POA: Diagnosis present

## 2022-10-09 DIAGNOSIS — Z9049 Acquired absence of other specified parts of digestive tract: Secondary | ICD-10-CM

## 2022-10-09 DIAGNOSIS — R188 Other ascites: Secondary | ICD-10-CM | POA: Diagnosis not present

## 2022-10-09 DIAGNOSIS — I739 Peripheral vascular disease, unspecified: Secondary | ICD-10-CM | POA: Diagnosis not present

## 2022-10-09 DIAGNOSIS — Z86010 Personal history of colonic polyps: Secondary | ICD-10-CM

## 2022-10-09 DIAGNOSIS — Z833 Family history of diabetes mellitus: Secondary | ICD-10-CM | POA: Diagnosis not present

## 2022-10-09 DIAGNOSIS — H269 Unspecified cataract: Secondary | ICD-10-CM | POA: Diagnosis present

## 2022-10-09 DIAGNOSIS — E278 Other specified disorders of adrenal gland: Secondary | ICD-10-CM | POA: Diagnosis not present

## 2022-10-09 DIAGNOSIS — Z825 Family history of asthma and other chronic lower respiratory diseases: Secondary | ICD-10-CM

## 2022-10-09 DIAGNOSIS — K219 Gastro-esophageal reflux disease without esophagitis: Secondary | ICD-10-CM | POA: Diagnosis present

## 2022-10-09 DIAGNOSIS — Z885 Allergy status to narcotic agent status: Secondary | ICD-10-CM

## 2022-10-09 DIAGNOSIS — Z79899 Other long term (current) drug therapy: Secondary | ICD-10-CM

## 2022-10-09 DIAGNOSIS — F17200 Nicotine dependence, unspecified, uncomplicated: Secondary | ICD-10-CM | POA: Diagnosis present

## 2022-10-09 LAB — CBC
HCT: 36.3 % (ref 36.0–46.0)
Hemoglobin: 11.8 g/dL — ABNORMAL LOW (ref 12.0–15.0)
MCH: 32 pg (ref 26.0–34.0)
MCHC: 32.5 g/dL (ref 30.0–36.0)
MCV: 98.4 fL (ref 80.0–100.0)
Platelets: 165 10*3/uL (ref 150–400)
RBC: 3.69 MIL/uL — ABNORMAL LOW (ref 3.87–5.11)
RDW: 14.4 % (ref 11.5–15.5)
WBC: 39 10*3/uL — ABNORMAL HIGH (ref 4.0–10.5)
nRBC: 0 % (ref 0.0–0.2)

## 2022-10-09 LAB — CBC WITH DIFFERENTIAL/PLATELET
Abs Immature Granulocytes: 0.5 10*3/uL — ABNORMAL HIGH (ref 0.00–0.07)
Abs Immature Granulocytes: 0.61 10*3/uL — ABNORMAL HIGH (ref 0.00–0.07)
Basophils Absolute: 0.1 10*3/uL (ref 0.0–0.1)
Basophils Absolute: 0.1 10*3/uL (ref 0.0–0.1)
Basophils Relative: 0 %
Basophils Relative: 0 %
Eosinophils Absolute: 0 10*3/uL (ref 0.0–0.5)
Eosinophils Absolute: 0 10*3/uL (ref 0.0–0.5)
Eosinophils Relative: 0 %
Eosinophils Relative: 0 %
HCT: 41.1 % (ref 36.0–46.0)
HCT: 38.3 % (ref 36.0–46.0)
Hemoglobin: 13.5 g/dL (ref 12.0–15.0)
Hemoglobin: 12.7 g/dL (ref 12.0–15.0)
Immature Granulocytes: 1 %
Immature Granulocytes: 2 %
Lymphocytes Relative: 6 %
Lymphocytes Relative: 7 %
Lymphs Abs: 2.3 10*3/uL (ref 0.7–4.0)
Lymphs Abs: 2.9 10*3/uL (ref 0.7–4.0)
MCH: 31.5 pg (ref 26.0–34.0)
MCH: 31.3 pg (ref 26.0–34.0)
MCHC: 32.8 g/dL (ref 30.0–36.0)
MCHC: 33.2 g/dL (ref 30.0–36.0)
MCV: 95.8 fL (ref 80.0–100.0)
MCV: 94.3 fL (ref 80.0–100.0)
Monocytes Absolute: 3 10*3/uL — ABNORMAL HIGH (ref 0.1–1.0)
Monocytes Absolute: 3.5 10*3/uL — ABNORMAL HIGH (ref 0.1–1.0)
Monocytes Relative: 7 %
Monocytes Relative: 9 %
Neutro Abs: 35.9 10*3/uL — ABNORMAL HIGH (ref 1.7–7.7)
Neutro Abs: 34 10*3/uL — ABNORMAL HIGH (ref 1.7–7.7)
Neutrophils Relative %: 86 %
Neutrophils Relative %: 82 %
Platelets: 198 10*3/uL (ref 150–400)
Platelets: 184 10*3/uL (ref 150–400)
RBC: 4.29 MIL/uL (ref 3.87–5.11)
RBC: 4.06 MIL/uL (ref 3.87–5.11)
RDW: 14.3 % (ref 11.5–15.5)
RDW: 14.2 % (ref 11.5–15.5)
Smear Review: NORMAL
Smear Review: NORMAL
WBC: 41.8 10*3/uL — ABNORMAL HIGH (ref 4.0–10.5)
WBC: 41.2 10*3/uL — ABNORMAL HIGH (ref 4.0–10.5)
nRBC: 0 % (ref 0.0–0.2)
nRBC: 0 % (ref 0.0–0.2)

## 2022-10-09 LAB — TYPE AND SCREEN
ABO/RH(D): A POS
ABO/RH(D): A POS
Antibody Screen: NEGATIVE
Antibody Screen: NEGATIVE

## 2022-10-09 LAB — LACTIC ACID, PLASMA
Lactic Acid, Venous: 2.4 mmol/L (ref 0.5–1.9)
Lactic Acid, Venous: 1.2 mmol/L (ref 0.5–1.9)

## 2022-10-09 LAB — ABO/RH: ABO/RH(D): A POS

## 2022-10-09 LAB — COMPREHENSIVE METABOLIC PANEL
ALT: 16 U/L (ref 0–44)
AST: 19 U/L (ref 15–41)
Albumin: 3.1 g/dL — ABNORMAL LOW (ref 3.5–5.0)
Alkaline Phosphatase: 45 U/L (ref 38–126)
Anion gap: 11 (ref 5–15)
BUN: 10 mg/dL (ref 8–23)
CO2: 26 mmol/L (ref 22–32)
Calcium: 9.3 mg/dL (ref 8.9–10.3)
Chloride: 99 mmol/L (ref 98–111)
Creatinine, Ser: 0.72 mg/dL (ref 0.44–1.00)
GFR, Estimated: >60 mL/min (ref >60)
Glucose, Bld: 132 mg/dL — ABNORMAL HIGH (ref 70–99)
Potassium: 2.9 mmol/L — ABNORMAL LOW (ref 3.5–5.1)
Sodium: 136 mmol/L (ref 135–145)
Total Bilirubin: 0.9 mg/dL (ref 0.3–1.2)
Total Protein: 6.5 g/dL (ref 6.5–8.1)

## 2022-10-09 LAB — C DIFFICILE QUICK SCREEN W PCR REFLEX
C Diff antigen: NEGATIVE
C Diff interpretation: NOT DETECTED
C Diff toxin: NEGATIVE

## 2022-10-09 LAB — POC OCCULT BLOOD, ED: Fecal Occult Bld: POSITIVE — AB

## 2022-10-09 LAB — PATHOLOGIST SMEAR REVIEW

## 2022-10-09 LAB — LIPASE, BLOOD: Lipase: 22 U/L (ref 11–51)

## 2022-10-09 MED ORDER — NICOTINE 14 MG/24HR TD PT24
14.0000 mg | MEDICATED_PATCH | Freq: Every day | TRANSDERMAL | Status: DC
  Filled 2022-10-09 (×3): qty 1

## 2022-10-09 MED ORDER — CYCLOBENZAPRINE HCL 5 MG PO TABS
5.0000 mg | ORAL_TABLET | Freq: Two times a day (BID) | ORAL | Status: DC
  Administered 2022-10-09 – 2022-10-12 (×8): 5 mg via ORAL
  Filled 2022-10-09 (×8): qty 1

## 2022-10-09 MED ORDER — FLUTICASONE PROPIONATE 50 MCG/ACT NA SUSP
1.0000 | Freq: Two times a day (BID) | NASAL | Status: DC
  Administered 2022-10-10 – 2022-10-12 (×6): 1 via NASAL
  Filled 2022-10-09: qty 16

## 2022-10-09 MED ORDER — TRAZODONE HCL 50 MG PO TABS
50.0000 mg | ORAL_TABLET | Freq: Every day | ORAL | Status: DC
  Administered 2022-10-09 – 2022-10-12 (×4): 50 mg via ORAL
  Filled 2022-10-09 (×4): qty 1

## 2022-10-09 MED ORDER — ACETAMINOPHEN 325 MG PO TABS
650.0000 mg | ORAL_TABLET | Freq: Four times a day (QID) | ORAL | Status: DC | PRN
  Administered 2022-10-12: 650 mg via ORAL
  Filled 2022-10-09: qty 2

## 2022-10-09 MED ORDER — ACETAMINOPHEN 650 MG RE SUPP: 650.0000 mg | Freq: Four times a day (QID) | RECTAL | Status: DC | PRN

## 2022-10-09 MED ORDER — ALBUTEROL SULFATE (2.5 MG/3ML) 0.083% IN NEBU
2.5000 mg | INHALATION_SOLUTION | Freq: Four times a day (QID) | RESPIRATORY_TRACT | Status: DC | PRN
  Administered 2022-10-10: 2.5 mg via RESPIRATORY_TRACT
  Filled 2022-10-09: qty 3

## 2022-10-09 MED ORDER — MELATONIN 3 MG PO TABS
3.0000 mg | ORAL_TABLET | Freq: Every day | ORAL | Status: DC
  Administered 2022-10-09 – 2022-10-12 (×4): 3 mg via ORAL
  Filled 2022-10-09 (×4): qty 1

## 2022-10-09 MED ORDER — PANTOPRAZOLE SODIUM 40 MG PO TBEC
40.0000 mg | DELAYED_RELEASE_TABLET | Freq: Two times a day (BID) | ORAL | Status: DC
  Administered 2022-10-09 – 2022-10-12 (×8): 40 mg via ORAL
  Filled 2022-10-09 (×8): qty 1

## 2022-10-09 MED ORDER — SODIUM CHLORIDE 0.9 % IV BOLUS
1000.0000 mL | Freq: Once | INTRAVENOUS | Status: AC
  Administered 2022-10-09: 1000 mL via INTRAVENOUS

## 2022-10-09 MED ORDER — LEVOCETIRIZINE DIHYDROCHLORIDE 5 MG PO TABS: 5.0000 mg | ORAL_TABLET | Freq: Every evening | ORAL | Status: DC

## 2022-10-09 MED ORDER — MOMETASONE FURO-FORMOTEROL FUM 200-5 MCG/ACT IN AERO
2.0000 | INHALATION_SPRAY | Freq: Two times a day (BID) | RESPIRATORY_TRACT | Status: DC
  Administered 2022-10-09 – 2022-10-12 (×7): 2 via RESPIRATORY_TRACT
  Filled 2022-10-09: qty 8.8

## 2022-10-09 MED ORDER — HYDRALAZINE HCL 20 MG/ML IJ SOLN
5.0000 mg | INTRAMUSCULAR | Status: DC | PRN
  Filled 2022-10-09: qty 1

## 2022-10-09 MED ORDER — METRONIDAZOLE 500 MG/100ML IV SOLN
500.0000 mg | Freq: Two times a day (BID) | INTRAVENOUS | Status: DC
  Administered 2022-10-09 – 2022-10-11 (×4): 500 mg via INTRAVENOUS
  Filled 2022-10-09 (×4): qty 100

## 2022-10-09 MED ORDER — ONDANSETRON HCL 4 MG/2ML IJ SOLN: 4.0000 mg | Freq: Four times a day (QID) | INTRAMUSCULAR | Status: DC | PRN

## 2022-10-09 MED ORDER — METRONIDAZOLE 500 MG/100ML IV SOLN
500.0000 mg | Freq: Once | INTRAVENOUS | Status: AC
  Administered 2022-10-09: 500 mg via INTRAVENOUS
  Filled 2022-10-09: qty 100

## 2022-10-09 MED ORDER — CIPROFLOXACIN IN D5W 400 MG/200ML IV SOLN
400.0000 mg | Freq: Once | INTRAVENOUS | Status: AC
  Administered 2022-10-09: 400 mg via INTRAVENOUS
  Filled 2022-10-09: qty 200

## 2022-10-09 MED ORDER — IOHEXOL 350 MG/ML SOLN
75.0000 mL | Freq: Once | INTRAVENOUS | Status: AC | PRN
  Administered 2022-10-09: 75 mL via INTRAVENOUS

## 2022-10-09 MED ORDER — NICOTINE POLACRILEX 2 MG MT GUM: 2.0000 mg | CHEWING_GUM | OROMUCOSAL | Status: DC | PRN

## 2022-10-09 MED ORDER — DULOXETINE HCL 60 MG PO CPEP
60.0000 mg | ORAL_CAPSULE | Freq: Every day | ORAL | Status: DC
  Administered 2022-10-09 – 2022-10-12 (×4): 60 mg via ORAL
  Filled 2022-10-09 (×3): qty 1
  Filled 2022-10-09: qty 2

## 2022-10-09 MED ORDER — POLYVINYL ALCOHOL 1.4 % OP SOLN: 1.0000 [drp] | Freq: Three times a day (TID) | OPHTHALMIC | Status: DC | PRN

## 2022-10-09 MED ORDER — LORATADINE 10 MG PO TABS
10.0000 mg | ORAL_TABLET | Freq: Every evening | ORAL | Status: DC
  Administered 2022-10-10 – 2022-10-12 (×3): 10 mg via ORAL
  Filled 2022-10-09 (×4): qty 1

## 2022-10-09 MED ORDER — ATORVASTATIN CALCIUM 20 MG PO TABS
20.0000 mg | ORAL_TABLET | Freq: Every day | ORAL | Status: DC
  Administered 2022-10-09 – 2022-10-12 (×4): 20 mg via ORAL
  Filled 2022-10-09 (×4): qty 1

## 2022-10-09 MED ORDER — SUVOREXANT 20 MG PO TABS: 20.0000 mg | ORAL_TABLET | Freq: Every day | ORAL | Status: DC

## 2022-10-09 MED ORDER — LACTATED RINGERS IV SOLN: INTRAVENOUS | Status: DC

## 2022-10-09 MED ORDER — PREGABALIN 75 MG PO CAPS
75.0000 mg | ORAL_CAPSULE | Freq: Two times a day (BID) | ORAL | Status: DC
  Administered 2022-10-09 – 2022-10-12 (×8): 75 mg via ORAL
  Filled 2022-10-09 (×7): qty 1
  Filled 2022-10-09: qty 3

## 2022-10-09 MED ORDER — FENTANYL CITRATE PF 50 MCG/ML IJ SOSY
50.0000 ug | PREFILLED_SYRINGE | Freq: Once | INTRAMUSCULAR | Status: AC
  Administered 2022-10-09: 50 ug via INTRAVENOUS
  Filled 2022-10-09: qty 1

## 2022-10-09 MED ORDER — CIPROFLOXACIN IN D5W 400 MG/200ML IV SOLN
400.0000 mg | Freq: Two times a day (BID) | INTRAVENOUS | Status: DC
  Administered 2022-10-09 – 2022-10-11 (×4): 400 mg via INTRAVENOUS
  Filled 2022-10-09 (×4): qty 200

## 2022-10-09 MED ORDER — SODIUM CHLORIDE 0.9% FLUSH
3.0000 mL | Freq: Two times a day (BID) | INTRAVENOUS | Status: DC
  Administered 2022-10-09 – 2022-10-12 (×5): 3 mL via INTRAVENOUS

## 2022-10-09 MED ORDER — MONTELUKAST SODIUM 10 MG PO TABS
10.0000 mg | ORAL_TABLET | Freq: Every day | ORAL | Status: DC
  Administered 2022-10-09 – 2022-10-12 (×4): 10 mg via ORAL
  Filled 2022-10-09 (×4): qty 1

## 2022-10-09 MED ORDER — AMLODIPINE BESYLATE 5 MG PO TABS
10.0000 mg | ORAL_TABLET | Freq: Every day | ORAL | Status: DC
  Administered 2022-10-11 – 2022-10-12 (×2): 10 mg via ORAL
  Filled 2022-10-09 (×3): qty 2

## 2022-10-09 MED ORDER — ASPIRIN 81 MG PO TBEC
81.0000 mg | DELAYED_RELEASE_TABLET | Freq: Every day | ORAL | Status: DC
  Administered 2022-10-09: 81 mg via ORAL
  Filled 2022-10-09: qty 1

## 2022-10-09 MED ORDER — POTASSIUM CHLORIDE CRYS ER 20 MEQ PO TBCR
40.0000 meq | EXTENDED_RELEASE_TABLET | ORAL | Status: AC
  Administered 2022-10-09 (×2): 40 meq via ORAL
  Filled 2022-10-09 (×2): qty 2

## 2022-10-09 MED ORDER — MORPHINE SULFATE (PF) 2 MG/ML IV SOLN
2.0000 mg | INTRAVENOUS | Status: DC | PRN
  Administered 2022-10-09 – 2022-10-10 (×4): 2 mg via INTRAVENOUS
  Filled 2022-10-09 (×4): qty 1

## 2022-10-09 MED ORDER — AZELASTINE HCL 0.1 % NA SOLN
1.0000 | Freq: Two times a day (BID) | NASAL | Status: DC
  Administered 2022-10-10 – 2022-10-12 (×6): 1 via NASAL
  Filled 2022-10-09: qty 30

## 2022-10-09 MED ORDER — AZELASTINE-FLUTICASONE 137-50 MCG/ACT NA SUSP: 1.0000 | Freq: Two times a day (BID) | NASAL | Status: DC

## 2022-10-09 MED ORDER — ONDANSETRON HCL 4 MG PO TABS: 4.0000 mg | ORAL_TABLET | Freq: Four times a day (QID) | ORAL | Status: DC | PRN

## 2022-10-09 NOTE — ED Notes (Signed)
Attending Yates MD, made aware of pt HR

## 2022-10-09 NOTE — ED Provider Notes (Signed)
[place] EMERGENCY DEPARTMENT AT [facility] Provider Note   CSN: [id number] Arrival date & time: 10/09/22  0236     History  Chief Complaint  Patient presents with   Abdominal Pain   Rectal Bleeding    Danielle Clay is a 72 y.o. female.  The history is provided by the patient and medical records.  Abdominal Pain Associated symptoms: diarrhea and hematochezia   Rectal Bleeding Associated symptoms: abdominal pain    72 y.o. F with history of hypertension, fibromyalgia, COPD, GERD, migraine headaches, presenting to the ED with rectal bleeding.  States she has had bloody diarrhea for the past 3 days, 5+ episodes daily.  She is having a lot of abdominal cramping with this.  Did have some nausea and vomiting initially but that is since resolved.  She is not really eating and drinking much as this seems to exacerbate her pain.  She denies any fever.  She takes daily aspirin but no formal anticoagulation.  She has never had issues like this in the past.  Denies any sick contacts.  No recent antibiotics use.  Home Medications Prior to Admission medications   Medication Sig Start Date End Date Taking? Authorizing Provider  albuterol (VENTOLIN HFA) 108 (90 Base) MCG/ACT inhaler Inhale 2 puffs into the lungs every 6 (six) hours as needed for wheezing or shortness of breath.    [provider]  amLODipine (NORVASC) 10 MG tablet Take 10 mg by mouth daily.    [provider]  aspirin EC 81 MG tablet Take 81 mg by mouth daily.    [provider]  atorvastatin (LIPITOR) 20 MG tablet Take 20 mg by mouth daily.    [provider]  Azelastine-Fluticasone 137-50 MCG/ACT SUSP Place 1 spray into the nose in the morning and at bedtime.    [provider]  budesonide-formoterol (SYMBICORT) 160-4.5 MCG/ACT inhaler Inhale 2 puffs into the lungs 2 (two) times daily.    [provider]  cholecalciferol (VITAMIN D) 25 MCG (1000 UNIT) tablet Take  1,000 Units by mouth daily.    [provider]  cyclobenzaprine (FLEXERIL) 5 MG tablet Take 5 mg by mouth 2 (two) times daily as needed for muscle spasms.    [provider]  DULoxetine (CYMBALTA) 60 MG capsule Take 1 capsule (60 mg total) by mouth daily. 01/10/17   Marcello Fennel, MD  guaiFENesin (MUCINEX) 600 MG 12 hr tablet Take 600 mg by mouth 2 (two) times daily as needed for cough or to loosen phlegm.    [provider]  hydroxypropyl methylcellulose / hypromellose (ISOPTO TEARS / GONIOVISC) 2.5 % ophthalmic solution Place 1 drop into both eyes 3 (three) times daily as needed for dry eyes.    [provider]  losartan (COZAAR) 25 MG tablet Take 25 mg by mouth daily.    [provider]  Menthol, Topical Analgesic, (BIOFREEZE) 4 % GEL Apply 1 application. topically 2 (two) times daily as needed (pain.).    [provider]  mineral oil-hydrophilic petrolatum (AQUAPHOR) ointment Apply 1 application. topically as needed for dry skin.    [provider]  montelukast (SINGULAIR) 10 MG tablet Take 10 mg by mouth daily.    [provider]  pantoprazole (PROTONIX) 40 MG tablet Take 40 mg by mouth in the morning and at bedtime.    [provider]  pregabalin (LYRICA) 75 MG capsule Take 75 mg by mouth 2 (two) times daily.    [provider]  senna-docusate (SENOKOT-S) 8.6-50 MG tablet Take 1 tablet by mouth at bedtime as needed for mild constipation.    [provider]  Suvorexant (BELSOMRA) 20 MG TABS Take 20 mg by mouth at bedtime.     [provider]  traZODone (DESYREL) 50 MG tablet Take 50 mg by mouth at bedtime.    [provider]  Varenicline Tartrate, Starter, 0.5 MG X 11 & 1 MG X 42 TBPK Take 0.5-1 mg by mouth as directed. Take 1 tablet (0.5 mg) once daily x 3 days, take 1 tablet (0.5 mg) by mouth twice daily on days 4-7, increase to 1 tablet (1 mg) by mouth twice daily thereafter.     [provider]      Allergies    Penicillins and Percocet [oxycodone-acetaminophen]    Review of Systems   Review of Systems  Gastrointestinal:  Positive for abdominal pain, blood in stool, diarrhea and hematochezia.  All other systems reviewed and are negative.   Physical Exam Updated Vital Signs BP 132/76   Pulse (!) 116   Temp 99.1 F (37.3 C) (Oral)   Resp (!) 21   SpO2 97%   Physical Exam Vitals and nursing note reviewed.  Constitutional:      Appearance: She is well-developed.  HENT:     Head: Normocephalic and atraumatic.  Eyes:     Conjunctiva/sclera: Conjunctivae normal.     Pupils: Pupils are equal, round, and reactive to light.  Cardiovascular:     Rate and Rhythm: Normal rate and regular rhythm.     Heart sounds: Normal heart sounds.  Pulmonary:     Effort: Pulmonary effort is normal.     Breath sounds: Normal breath sounds.  Abdominal:     General: Bowel sounds are normal.     Palpations: Abdomen is soft.     Tenderness: There is generalized abdominal tenderness.  Genitourinary:    Comments: Exam chaperoned by NT Loose, grossly bloody stool on DRE Musculoskeletal:        General: Normal range of motion.     Cervical back: Normal range of motion.  Skin:    General: Skin is warm and dry.  Neurological:     Mental Status: She is alert and oriented to person, place, and time.     ED Results / Procedures / Treatments   Labs (all labs ordered are listed, but only abnormal results are displayed) Labs Reviewed  CBC WITH DIFFERENTIAL/PLATELET - Abnormal; Notable for the following components:      Result Value   WBC 41.8 (*)    Neutro Abs 35.9 (*)    Monocytes Absolute 3.0 (*)    Abs Immature Granulocytes 0.50 (*)    All other components within normal limits  COMPREHENSIVE METABOLIC PANEL - Abnormal; Notable for the following components:   Potassium 2.9 (*)    Glucose, Bld 132 (*)    Albumin 3.1 (*)    All other components within  normal limits  LACTIC ACID, PLASMA - Abnormal; Notable for the following components:   Lactic Acid, Venous 2.4 (*)    All other components within normal limits  CBC WITH DIFFERENTIAL/PLATELET - Abnormal; Notable for the following components:   WBC 41.2 (*)    Neutro Abs 34.0 (*)    Monocytes Absolute 3.5 (*)    Abs Immature Granulocytes 0.61 (*)    All other components within normal limits  POC OCCULT BLOOD, ED - Abnormal; Notable for the following components:  Fecal Occult Bld POSITIVE (*)    All other components within normal limits  C DIFFICILE QUICK SCREEN W PCR REFLEX    GASTROINTESTINAL PANEL BY PCR, STOOL (REPLACES STOOL CULTURE)  LIPASE, BLOOD  LACTIC ACID, PLASMA  PATHOLOGIST SMEAR REVIEW  TYPE AND SCREEN  ABO/RH    EKG EKG Interpretation Date/Time:  Monday October 09 2022 02:55:57 EDT Ventricular Rate:  114 PR Interval:  184 QRS Duration:  90 QT Interval:  318 QTC Calculation: 438 R Axis:   52  Text Interpretation: Sinus tachycardia Confirmed by Palumbo, April ([id number]) on 10/09/2022 2:58:30 AM  Radiology CT ABDOMEN PELVIS W CONTRAST  Result Date: 10/09/2022 CLINICAL DATA:  72 year old female with history of acute onset of nonlocalized abdominal pain. EXAM: CT ABDOMEN AND PELVIS WITH CONTRAST TECHNIQUE: Multidetector CT imaging of the abdomen and pelvis was performed using the standard protocol following bolus administration of intravenous contrast. RADIATION DOSE REDUCTION: This exam was performed according to the departmental dose-optimization program which includes automated exposure control, adjustment of the mA and/or kV according to patient size and/or use of iterative reconstruction technique. CONTRAST:  75mL OMNIPAQUE IOHEXOL 350 MG/ML SOLN COMPARISON:  No priors. FINDINGS: Lower chest: Emphysematous changes are noted throughout the visualized lung bases. Atherosclerotic calcifications in the thoracic aorta as well as the left anterior descending, left circumflex and  right coronary arteries. Calcification of the aortic valve. Lipomatous hypertrophy of the interatrial septum (normal anatomical variant) incidentally noted. Small hiatal hernia. Hepatobiliary: Liver has a shrunken appearance and nodular contour, suggesting underlying cirrhosis. No discrete cystic or solid hepatic lesions are confidently identified on today's examination. Status post cholecystectomy. Pancreas: No definite pancreatic mass or peripancreatic fluid collections or inflammatory changes are noted on today's noncontrast CT examination. Spleen: Well-defined 3.8 x 3.0 cm low-attenuation lesion in the spleen has a benign appearance, presumably a cyst (no imaging follow-up recommended). Adrenals/Urinary Tract: Nonobstructive calculi are noted within the collecting systems of both kidneys measuring up to 6 mm in the interpolar collecting system of the right kidney. Subcentimeter low-attenuation lesions in both kidneys are too small to definitively characterize, but statistically likely to represent cysts (no imaging follow-up recommended). No aggressive appearing renal lesions. No hydroureteronephrosis. Urinary bladder is unremarkable in appearance. Left adrenal gland is normal. 1.1 x 1.0 cm right adrenal nodule (axial image 26 of series 3), incompletely characterize, but statistically likely a small adrenal adenoma. Stomach/Bowel: The appearance of the stomach is normal. No pathologic dilatation of small bowel or colon. Severe circumferential thickening of the colon extending from the level of the cecum to the distal transverse colon, with associated mucosal hyperenhancement and surrounding pericolonic inflammatory changes throughout this region, indicative of severe colitis. Normal appendix. Vascular/Lymphatic: Aortic atherosclerosis, without evidence of aneurysm or dissection in the abdominal or pelvic vasculature. No lymphadenopathy noted in the abdomen or pelvis. Reproductive: Densely calcified lesions  identified in the uterus measuring up to 2.2 cm in diameter, presumably fibroids. Ovaries are atrophic and otherwise unremarkable in appearance. Other: Small volume of ascites predominantly in the low anatomic pelvis. No pneumoperitoneum. Musculoskeletal: There are no aggressive appearing lytic or blastic lesions noted in the visualized portions of the skeleton. IMPRESSION: 1. Severe colitis extending from the cecum to the distal transverse colon. 2. Small volume of ascites, presumably reactive. 3. Morphologic changes in the liver indicative of underlying cirrhosis. 4. Aortic atherosclerosis. 5. Nonobstructing renal calculi in the collecting systems of both kidneys measuring up to 6 mm in the interpolar collecting system of the right kidney.  6. 1.0 x 1.1 cm right adrenal nodule is indeterminate, but statistically likely a small adenoma. Follow-up adrenal protocol CT scan should be considered in 12 months to re-evaluate this finding and ensure stability. This recommendation follows ACR consensus guidelines: Management of Incidental Adrenal Masses: A White Paper of the ACR Incidental Findings Committee. J Am Coll Radiol 2017;14:1038-1044. 7. Additional incidental findings, as above. Electronically Signed   By: Trudie Reed M.D.   On: 10/09/2022 05:19    Procedures Procedures    CRITICAL CARE Performed by: Garlon Hatchet   Total critical care time: 45 minutes  Critical care time was exclusive of separately billable procedures and treating other patients.  Critical care was necessary to treat or prevent imminent or life-threatening deterioration.  Critical care was time spent personally by me on the following activities: development of treatment plan with patient and/or surrogate as well as nursing, discussions with consultants, evaluation of patient's response to treatment, examination of patient, obtaining history from patient or surrogate, ordering and performing treatments and interventions,  ordering and review of laboratory studies, ordering and review of radiographic studies, pulse oximetry and re-evaluation of patient's condition.   Medications Ordered in ED Medications  ciprofloxacin (CIPRO) IVPB 400 mg (has no administration in time range)  metroNIDAZOLE (FLAGYL) IVPB 500 mg (has no administration in time range)  sodium chloride 0.9 % bolus 1,000 mL (1,000 mLs Intravenous New Bag/Given 10/09/22 0441)  iohexol (OMNIPAQUE) 350 MG/ML injection 75 mL (75 mLs Intravenous Contrast Given 10/09/22 0434)  fentaNYL (SUBLIMAZE) injection 50 mcg (50 mcg Intravenous Given 10/09/22 0600)    ED Course/ Medical Decision Making/ A&P                             Medical Decision Making Amount and/or Complexity of Data Reviewed Labs: ordered. Radiology: ordered and independent interpretation performed. ECG/medicine tests: ordered and independent interpretation performed.  Risk Prescription drug management. Decision regarding hospitalization.   72 year old female presenting to the ED with abdominal pain and 3 days of bloody diarrhea.  She takes daily aspirin but no formal anticoagulation.  She has not had any recent travel or recent antibiotics use.  She is afebrile and nontoxic in appearance.  She does have some generalized tenderness.  Rectal exam with grossly bloody, loose stool.  Concern for possible colitis versus diverticulitis or similar.  Will plan for labs, lactate, type and screen, CT scan.  Labs as above--significant leukocytosis at 41,000.  This was repeated to confirm.  Hemoglobin is stable at 13.  Lipase is normal.  Lactate 2.4.  She is given IV fluids.  CT with findings of severe colitis but no bowel perforation or abscess.  Patient has penicillin allergy so started on Cipro/Flagyl.  Stool studies have been ordered, however she is not had any diarrhea while here in the emergency department to collect sample.  She will require admission.  Discussed with hospitalist, Dr.  Loney Loh-- will admit for ongoing care.  Final Clinical Impression(s) / ED Diagnoses Final diagnoses:  Colitis    Rx / DC Orders ED Discharge Orders     None         Garlon Hatchet, PA-C 10/09/22 1914    Nicanor Alcon, April, MD 10/09/22 7829

## 2022-10-09 NOTE — ED Triage Notes (Signed)
Pt BIB EMS. Bloody diarrhea x 3 days. Last solid BM was Thursday. + thinners. Having diffuse abdominal pain

## 2022-10-09 NOTE — H&P (Addendum)
History and Physical    Patient: Danielle Clay AVW:[id number] DOB: [birth year] DOA: 10/09/2022 DOS: the patient was seen and examined on 10/09/2022 PCP: Jethro Bastos, MD  Patient coming from: Home - lives alone; cared for by [facility]; NOK:  Daughter, Lupita Leash [phone]   Chief Complaint: Bloody diarrhea  HPI: Danielle Clay is a 72 y.o. female with medical history significant of COPD, PAD, and HTN presenting with bloody diarrhea.  She reports that she started having a lot of bloody diarrhea starting Friday.  No stool, just blood.  TNTC episodes.  She had vomiting Friday, none since, has had persistent nausea.  No fever.  Her daughter sent her some shakes that she had never tried before, no other new foods.  She is having lower abdominal pain.    ER Course:  Carryover, per Dr. Loney Loh:  Bloody diarrhea x 3 days.  Tachycardic but blood pressure stable. Hemoglobin 12.7, WBC 41.2, lactate 2.4.  CT showing severe colitis extending from the cecum to the distal transverse colon.  She was given Cipro, Flagyl, and 1 L IV fluid bolus.      Review of Systems: As mentioned in the history of present illness. All other systems reviewed and are negative. Past Medical History:  Diagnosis Date   Allergy    seasonal   Arthritis    Bronchial asthma    Colon polyps    Constipation    COPD (chronic obstructive pulmonary disease) (HCC)    Early cataracts, bilateral    Fibromyalgia    Fracture    right index finger proximal phalanx fracture   GERD (gastroesophageal reflux disease)    Hypertension    Migraine    Osteoporosis    takes vitamins for this.   Wears glasses    Past Surgical History:  Procedure Laterality Date   ABDOMINAL AORTOGRAM W/LOWER EXTREMITY N/A 06/20/2021   Procedure: ABDOMINAL AORTOGRAM W/LOWER EXTREMITY;  Surgeon: Maeola Harman, MD;  Location: [facility] INVASIVE CV LAB;  Service: Cardiovascular;  Laterality: N/A;   BREAST EXCISIONAL BIOPSY Right     CHOLECYSTECTOMY     CLOSED REDUCTION FINGER WITH PERCUTANEOUS PINNING Right 03/05/2019   Procedure: CLOSED REDUCTION FINGER WITH PERCUTANEOUS PINNING AND OPEN REDUCTION AND INTERNAL FIXTATION RIGHT INDEX FINGER;  Surgeon: Bradly Bienenstock, MD;  Location: [facility] OR;  Service: Orthopedics;  Laterality: Right;  REGINAL WITH IV SEDATION   COLONOSCOPY     6 yrs ago with polyps   COLONOSCOPY W/ BIOPSIES AND POLYPECTOMY     CYST REMOVAL HAND     Under left breast   GALLBLADDER SURGERY     PERIPHERAL VASCULAR INTERVENTION Bilateral 06/20/2021   Procedure: PERIPHERAL VASCULAR INTERVENTION;  Surgeon: Maeola Harman, MD;  Location: [facility] INVASIVE CV LAB;  Service: Cardiovascular;  Laterality: Bilateral;  External Iliacs   POLYPECTOMY     TONSILLECTOMY AND ADENOIDECTOMY     TUBAL LIGATION     WISDOM TOOTH EXTRACTION     Social History:  reports that she has been smoking cigarettes. She has been smoking an average of 1 pack per day. She has never used smokeless tobacco. She reports current alcohol use. She reports current drug use. Drug: Marijuana.  Allergies  Allergen Reactions   Penicillins Swelling    Did it involve swelling of the face/tongue/throat, SOB, or low BP? Yes Did it involve sudden or severe rash/hives, skin peeling, or any reaction on the inside of your mouth or nose? Yes Did you need to seek medical attention at  a hospital or doctor's office? Yes When did it last happen?      1970 If all above answers are "NO", may proceed with cephalosporin use.    Percocet [Oxycodone-Acetaminophen] Itching    Family History  Problem Relation Age of Onset   Diabetes Mother    Anemia Mother    HIV Sister    Heart Problems Brother    Asthma Brother    HIV Brother    HIV Brother    Esophageal cancer Maternal Uncle        can't remember when   Colon cancer Neg Hx    Colon polyps Neg Hx    Stomach cancer Neg Hx    Rectal cancer Neg Hx     Prior to Admission medications   Medication Sig  Start Date End Date Taking? Authorizing Provider  albuterol (VENTOLIN HFA) 108 (90 Base) MCG/ACT inhaler Inhale 2 puffs into the lungs every 6 (six) hours as needed for wheezing or shortness of breath.   Yes [provider]  amLODipine (NORVASC) 10 MG tablet Take 10 mg by mouth daily.   Yes [provider]  antiseptic oral rinse (BIOTENE) LIQD 15 mLs by Mouth Rinse route as needed for dry mouth.   Yes [provider]  aspirin EC 81 MG tablet Take 81 mg by mouth daily.   Yes [provider]  atorvastatin (LIPITOR) 20 MG tablet Take 20 mg by mouth daily.   Yes [provider]  Azelastine-Fluticasone 137-50 MCG/ACT SUSP Place 1 spray into the nose in the morning and at bedtime.   Yes [provider]  budesonide-formoterol (SYMBICORT) 160-4.5 MCG/ACT inhaler Inhale 2 puffs into the lungs 2 (two) times daily.   Yes [provider]  cholecalciferol (VITAMIN D) 25 MCG (1000 UNIT) tablet Take 1,000 Units by mouth daily.   Yes [provider]  cyclobenzaprine (FLEXERIL) 5 MG tablet Take 5 mg by mouth in the morning and at bedtime.   Yes [provider]  DULoxetine (CYMBALTA) 60 MG capsule Take 1 capsule (60 mg total) by mouth daily. 01/10/17  Yes Marcello Fennel, MD  hydroxypropyl methylcellulose / hypromellose (ISOPTO TEARS / GONIOVISC) 2.5 % ophthalmic solution Place 1 drop into both eyes 3 (three) times daily as needed for dry eyes.   Yes [provider]  levocetirizine (XYZAL) 5 MG tablet Take 5 mg by mouth every evening.   Yes [provider]  losartan (COZAAR) 25 MG tablet Take 25 mg by mouth daily.   Yes [provider]  melatonin 3 MG TABS tablet Take 3 mg by mouth at bedtime.   Yes [provider]  Menthol, Topical Analgesic, (BIOFREEZE) 4 % GEL Apply 1 application. topically 2 (two) times daily as needed (pain.).   Yes [provider]  mineral oil-hydrophilic petrolatum  (AQUAPHOR) ointment Apply 1 application. topically as needed for dry skin.   Yes [provider]  montelukast (SINGULAIR) 10 MG tablet Take 10 mg by mouth daily.   Yes [provider]  nicotine polacrilex (NICORETTE) 2 MG gum Take 2 mg by mouth as needed for smoking cessation.   Yes [provider]  pantoprazole (PROTONIX) 40 MG tablet Take 40 mg by mouth in the morning and at bedtime.   Yes [provider]  pregabalin (LYRICA) 75 MG capsule Take 75 mg by mouth 2 (two) times daily.   Yes [provider]  senna-docusate (SENOKOT-S) 8.6-50 MG tablet Take 1 tablet by mouth at bedtime  as needed for mild constipation.   Yes [provider]  Suvorexant (BELSOMRA) 20 MG TABS Take 20 mg by mouth at bedtime.    Yes [provider]  traZODone (DESYREL) 50 MG tablet Take 50 mg by mouth at bedtime.   Yes [provider]  Varenicline Tartrate, Starter, 0.5 MG X 11 & 1 MG X 42 TBPK Take 0.5-1 mg by mouth as directed. Take 1 tablet (0.5 mg) once daily x 3 days, take 1 tablet (0.5 mg) by mouth twice daily on days 4-7, increase to 1 tablet (1 mg) by mouth twice daily thereafter. Patient not taking: Reported on 10/09/2022    [provider]    Physical Exam: Vitals:   10/09/22 1024 10/09/22 1100 10/09/22 1138 10/09/22 1600  BP:  133/75 130/73 104/63  Pulse:  (!) 116 (!) 125 (!) 108  Resp:  (!) 23 20 20   Temp: 98.9 F (37.2 C)  98.8 F (37.1 C) 99.5 F (37.5 C)  TempSrc: Oral  Oral Oral  SpO2:  95% 99% 93%  Weight:   76.5 kg   Height:   5\' 4"  (1.626 m)    General:  Appears calm and mildly uncomfortable  Eyes:  EOMI, normal lids, iris ENT:  grossly normal hearing, lips & tongue, mmm Neck:  no LAD, masses or thyromegaly Cardiovascular:  RR with mild tachycardia, no m/r/g. No LE edema.  Respiratory:   CTA bilaterally with no wheezes/rales/rhonchi.  Normal respiratory effort. Abdomen:  soft, diffusely TTP but worse in BLQ Skin:   no rash or induration seen on limited exam Musculoskeletal:  grossly normal tone BUE/BLE, good ROM, no bony abnormality Psychiatric:  grossly normal mood and affect, speech fluent and appropriate, AOx3 Neurologic:  CN 2-12 grossly intact, moves all extremities in coordinated fashion   Radiological Exams on Admission: Independently reviewed - see discussion in A/P where applicable  CT ABDOMEN PELVIS W CONTRAST  Result Date: 10/09/2022 CLINICAL DATA:  72 year old female with history of acute onset of nonlocalized abdominal pain. EXAM: CT ABDOMEN AND PELVIS WITH CONTRAST TECHNIQUE: Multidetector CT imaging of the abdomen and pelvis was performed using the standard protocol following bolus administration of intravenous contrast. RADIATION DOSE REDUCTION: This exam was performed according to the departmental dose-optimization program which includes automated exposure control, adjustment of the mA and/or kV according to patient size and/or use of iterative reconstruction technique. CONTRAST:  75mL OMNIPAQUE IOHEXOL 350 MG/ML SOLN COMPARISON:  No priors. FINDINGS: Lower chest: Emphysematous changes are noted throughout the visualized lung bases. Atherosclerotic calcifications in the thoracic aorta as well as the left anterior descending, left circumflex and right coronary arteries. Calcification of the aortic valve. Lipomatous hypertrophy of the interatrial septum (normal anatomical variant) incidentally noted. Small hiatal hernia. Hepatobiliary: Liver has a shrunken appearance and nodular contour, suggesting underlying cirrhosis. No discrete cystic or solid hepatic lesions are confidently identified on today's examination. Status post cholecystectomy. Pancreas: No definite pancreatic mass or peripancreatic fluid collections or inflammatory changes are noted on today's noncontrast CT examination. Spleen: Well-defined 3.8 x 3.0 cm low-attenuation lesion in the spleen has a benign appearance, presumably a cyst (no  imaging follow-up recommended). Adrenals/Urinary Tract: Nonobstructive calculi are noted within the collecting systems of both kidneys measuring up to 6 mm in the interpolar collecting system of the right kidney. Subcentimeter low-attenuation lesions in both kidneys are too small to definitively characterize, but statistically likely to represent cysts (no imaging follow-up recommended). No aggressive appearing renal lesions. No hydroureteronephrosis. Urinary bladder is  unremarkable in appearance. Left adrenal gland is normal. 1.1 x 1.0 cm right adrenal nodule (axial image 26 of series 3), incompletely characterize, but statistically likely a small adrenal adenoma. Stomach/Bowel: The appearance of the stomach is normal. No pathologic dilatation of small bowel or colon. Severe circumferential thickening of the colon extending from the level of the cecum to the distal transverse colon, with associated mucosal hyperenhancement and surrounding pericolonic inflammatory changes throughout this region, indicative of severe colitis. Normal appendix. Vascular/Lymphatic: Aortic atherosclerosis, without evidence of aneurysm or dissection in the abdominal or pelvic vasculature. No lymphadenopathy noted in the abdomen or pelvis. Reproductive: Densely calcified lesions identified in the uterus measuring up to 2.2 cm in diameter, presumably fibroids. Ovaries are atrophic and otherwise unremarkable in appearance. Other: Small volume of ascites predominantly in the low anatomic pelvis. No pneumoperitoneum. Musculoskeletal: There are no aggressive appearing lytic or blastic lesions noted in the visualized portions of the skeleton. IMPRESSION: 1. Severe colitis extending from the cecum to the distal transverse colon. 2. Small volume of ascites, presumably reactive. 3. Morphologic changes in the liver indicative of underlying cirrhosis. 4. Aortic atherosclerosis. 5. Nonobstructing renal calculi in the collecting systems of both kidneys  measuring up to 6 mm in the interpolar collecting system of the right kidney. 6. 1.0 x 1.1 cm right adrenal nodule is indeterminate, but statistically likely a small adenoma. Follow-up adrenal protocol CT scan should be considered in 12 months to re-evaluate this finding and ensure stability. This recommendation follows ACR consensus guidelines: Management of Incidental Adrenal Masses: A White Paper of the ACR Incidental Findings Committee. J Am Coll Radiol 2017;14:1038-1044. 7. Additional incidental findings, as above. Electronically Signed   By: Trudie Reed M.D.   On: 10/09/2022 05:19    EKG: Independently reviewed.  Sinus tachycardia with rate 114; no evidence of acute ischemia   Labs on Admission: I have personally reviewed the available labs and imaging studies at the time of the admission.  Pertinent labs:    K+ 2.9 Glucose 132 Albumin 3.1 Lactate 2.4, 1.2 WBC 41.8, 41.2, 39 Hgb 13.5 -> 12.7 -> 11.8 Heme positive   Assessment and Plan: Principal Problem:   Colitis Active Problems:   Essential (primary) hypertension   COPD (chronic obstructive pulmonary disease) (HCC)   PAD (peripheral artery disease) (HCC)   Mood disorder (HCC)   Insomnia   Tobacco dependence    Colitis -Patient presenting with abdominal pain, bloody diarrhea -No apparent infectious source noted but this is a consideration -CT with "severe colitis" from the cecum to the distal transverse colon -She has marked leukocytosis (41.8) with tachycardia, tachypnea -Her lactate was elevated on presentation, has cleared - concerning for sepsis -GI pathogen panel and C diff are pending -For now, will give bowel rest (clears only), IVF, pain medication with morphine, nausea medication Zofran -Will treat with Cipro/Flagyl for intraabdominal infection -If not improving, she may need GI/surgery consultation -Continue to monitor CBC q12h as long as she is continuing to have bleeding  COPD -Continue Symbicort  (Dulera per formulary), albuterol, Xyzal, Singulair, Azelastine-fluticasone  PAD -Hold ASA -Continue atorvastatin  HTN -Continue amlodipine -Hold losartan for now  Mood d/o/Insomnia -Continue Cymbalta, melatonin, Lyrica, Belsomra, trazodone  Cirrhosis -Incidental finding on CT with small volume ascites -Needs outpatient PCP/GI follow up  Nephrolithiasis -History of -Imaging with multiple nonobstructing renal calculi -No intervention needed  Adrenal nodule -Needs f/u CT in 12 months  Tobacco dependence -Encourage cessation.   -This was discussed with the patient and  should be reviewed on an ongoing basis.   -Patch ordered, gum also available if needed     Advance Care Planning:   Code Status: Full Code - Code status was discussed with the patient and/or family at the time of admission.  The patient would want to receive full resuscitative measures at this time.   Consults: None  DVT Prophylaxis: SCDs  Family Communication: None present; I attempted to call her daughter without success  Severity of Illness: The appropriate patient status for this patient is OBSERVATION. Observation status is judged to be reasonable and necessary in order to provide the required intensity of service to ensure the patient's safety. The patient's presenting symptoms, physical exam findings, and initial radiographic and laboratory data in the context of their medical condition is felt to place them at decreased risk for further clinical deterioration. Furthermore, it is anticipated that the patient will be medically stable for discharge from the hospital within 2 midnights of admission.   Author: Jonah Blue, MD 10/09/2022 5:12 PM  For on call review [email or web address].

## 2022-10-09 NOTE — TOC Initial Note (Signed)
Transition of Care [facility]) - Initial/Assessment Note    Patient Details  Name: Danielle Clay MRN: [id number] Date of Birth: 1951/02/18  Transition of Care [facility]) CM/SW Contact:    Danielle Rucks, RN Phone Number: 10/09/2022, 2:51 PM  Clinical Narrative:   Call received from [place], [place] with [facility], phone : [phone], pt's primary care provider,  inquiring on status of patient and expected discharge date. Per Danielle Clay, transportation can be arranged for the patient, reports she will call NCM tomorrow for update. TOC will continue to follow.                       Patient Goals and CMS Choice            Expected Discharge Plan and Services                                              Prior Living Arrangements/Services                       Activities of Daily Living Home Assistive Devices/Equipment: Cane (specify quad or straight), Eyeglasses, Walker (specify type) ADL Screening (condition at time of admission) Patient's cognitive ability adequate to safely complete daily activities?: Yes Is the patient deaf or have difficulty hearing?: No Does the patient have difficulty seeing, even when wearing glasses/contacts?: Yes Does the patient have difficulty concentrating, remembering, or making decisions?: No Patient able to express need for assistance with ADLs?: Yes Does the patient have difficulty dressing or bathing?: No Independently performs ADLs?: Yes (appropriate for developmental age) Does the patient have difficulty walking or climbing stairs?: Yes Weakness of Legs: Both Weakness of Arms/Hands: None  Permission Sought/Granted                  Emotional Assessment              Admission diagnosis:  Colitis [K52.9] Patient Active Problem List   Diagnosis Date Noted   Colitis 10/09/2022   Chronic pain syndrome 07/27/2021   Encntr for general adult medical exam w/o abnormal findings 07/27/2021   Essential (primary)  hypertension 07/27/2021   History of cervical dysplasia 07/27/2021   Low back pain 07/27/2021   Vitamin D deficiency 07/27/2021   Dizziness 08/07/2017   Laryngopharyngeal reflux (LPR) 08/07/2017   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/2017   Eustachian tube dysfunction, bilateral 06/12/2017   Sensorineural hearing loss (SNHL), bilateral 06/12/2017   TMJ (sprain of temporomandibular joint), initial encounter 06/12/2017   Body mass index (bmi) 33.0-33.9, adult 08/02/2016   PCP:  Danielle Bastos, MD Pharmacy:  No Pharmacies Listed    Social Determinants of Health (SDOH) Social History: SDOH Screenings   Food Insecurity: No Food Insecurity (10/09/2022)  Housing: Low Risk  (10/09/2022)  Transportation Needs: No Transportation Needs (10/09/2022)  Utilities: Not At Risk (10/09/2022)  Tobacco Use: High Risk (10/09/2022)   SDOH Interventions:     Readmission Risk Interventions     No data to display

## 2022-10-09 NOTE — ED Notes (Signed)
Secretary to call [facility] for transport for transfer to [facility]

## 2022-10-09 NOTE — Plan of Care (Signed)
  Problem: Activity: Goal: Risk for activity intolerance will decrease Outcome: Progressing   Problem: Safety: Goal: Ability to remain free from injury will improve Outcome: Progressing   Problem: Skin Integrity: Goal: Risk for impaired skin integrity will decrease Outcome: Progressing   

## 2022-10-09 NOTE — ED Notes (Signed)
[facility] on unit to transport pt to [facility]

## 2022-10-10 DIAGNOSIS — Z833 Family history of diabetes mellitus: Secondary | ICD-10-CM | POA: Diagnosis not present

## 2022-10-10 DIAGNOSIS — F39 Unspecified mood [affective] disorder: Secondary | ICD-10-CM | POA: Diagnosis present

## 2022-10-10 DIAGNOSIS — I10 Essential (primary) hypertension: Secondary | ICD-10-CM

## 2022-10-10 DIAGNOSIS — K7460 Unspecified cirrhosis of liver: Secondary | ICD-10-CM | POA: Diagnosis present

## 2022-10-10 DIAGNOSIS — E876 Hypokalemia: Secondary | ICD-10-CM | POA: Diagnosis present

## 2022-10-10 DIAGNOSIS — G4700 Insomnia, unspecified: Secondary | ICD-10-CM | POA: Diagnosis present

## 2022-10-10 DIAGNOSIS — J449 Chronic obstructive pulmonary disease, unspecified: Secondary | ICD-10-CM | POA: Diagnosis present

## 2022-10-10 DIAGNOSIS — E872 Acidosis, unspecified: Secondary | ICD-10-CM | POA: Diagnosis present

## 2022-10-10 DIAGNOSIS — M810 Age-related osteoporosis without current pathological fracture: Secondary | ICD-10-CM | POA: Diagnosis present

## 2022-10-10 DIAGNOSIS — E278 Other specified disorders of adrenal gland: Secondary | ICD-10-CM | POA: Diagnosis present

## 2022-10-10 DIAGNOSIS — K529 Noninfective gastroenteritis and colitis, unspecified: Secondary | ICD-10-CM | POA: Diagnosis not present

## 2022-10-10 DIAGNOSIS — A09 Infectious gastroenteritis and colitis, unspecified: Secondary | ICD-10-CM | POA: Diagnosis present

## 2022-10-10 DIAGNOSIS — R188 Other ascites: Secondary | ICD-10-CM | POA: Diagnosis present

## 2022-10-10 DIAGNOSIS — Z880 Allergy status to penicillin: Secondary | ICD-10-CM | POA: Diagnosis not present

## 2022-10-10 DIAGNOSIS — A419 Sepsis, unspecified organism: Secondary | ICD-10-CM | POA: Diagnosis present

## 2022-10-10 DIAGNOSIS — Z825 Family history of asthma and other chronic lower respiratory diseases: Secondary | ICD-10-CM | POA: Diagnosis not present

## 2022-10-10 DIAGNOSIS — Z800 Family history of malignant neoplasm of digestive organs: Secondary | ICD-10-CM | POA: Diagnosis not present

## 2022-10-10 DIAGNOSIS — N200 Calculus of kidney: Secondary | ICD-10-CM | POA: Diagnosis present

## 2022-10-10 DIAGNOSIS — M797 Fibromyalgia: Secondary | ICD-10-CM | POA: Diagnosis present

## 2022-10-10 DIAGNOSIS — F17210 Nicotine dependence, cigarettes, uncomplicated: Secondary | ICD-10-CM | POA: Diagnosis present

## 2022-10-10 DIAGNOSIS — Z7951 Long term (current) use of inhaled steroids: Secondary | ICD-10-CM | POA: Diagnosis not present

## 2022-10-10 DIAGNOSIS — I739 Peripheral vascular disease, unspecified: Secondary | ICD-10-CM | POA: Diagnosis present

## 2022-10-10 DIAGNOSIS — Z7982 Long term (current) use of aspirin: Secondary | ICD-10-CM | POA: Diagnosis not present

## 2022-10-10 DIAGNOSIS — Z86010 Personal history of colonic polyps: Secondary | ICD-10-CM | POA: Diagnosis not present

## 2022-10-10 DIAGNOSIS — R6520 Severe sepsis without septic shock: Secondary | ICD-10-CM | POA: Diagnosis present

## 2022-10-10 LAB — BASIC METABOLIC PANEL
Anion gap: 8 (ref 5–15)
BUN: 7 mg/dL — ABNORMAL LOW (ref 8–23)
CO2: 26 mmol/L (ref 22–32)
Calcium: 8.6 mg/dL — ABNORMAL LOW (ref 8.9–10.3)
Chloride: 103 mmol/L (ref 98–111)
Creatinine, Ser: 0.45 mg/dL (ref 0.44–1.00)
GFR, Estimated: >60 mL/min (ref >60)
Glucose, Bld: 121 mg/dL — ABNORMAL HIGH (ref 70–99)
Potassium: 3.3 mmol/L — ABNORMAL LOW (ref 3.5–5.1)
Sodium: 137 mmol/L (ref 135–145)

## 2022-10-10 LAB — GASTROINTESTINAL PANEL BY PCR, STOOL (REPLACES STOOL CULTURE)

## 2022-10-10 LAB — CBC
HCT: 33.6 % — ABNORMAL LOW (ref 36.0–46.0)
Hemoglobin: 10.7 g/dL — ABNORMAL LOW (ref 12.0–15.0)
MCH: 31.6 pg (ref 26.0–34.0)
MCHC: 31.8 g/dL (ref 30.0–36.0)
MCV: 99.1 fL (ref 80.0–100.0)
Platelets: 142 10*3/uL — ABNORMAL LOW (ref 150–400)
RBC: 3.39 MIL/uL — ABNORMAL LOW (ref 3.87–5.11)
RDW: 14.3 % (ref 11.5–15.5)
WBC: 32.9 10*3/uL — ABNORMAL HIGH (ref 4.0–10.5)
nRBC: 0 % (ref 0.0–0.2)

## 2022-10-10 MED ORDER — POTASSIUM CHLORIDE CRYS ER 20 MEQ PO TBCR
40.0000 meq | EXTENDED_RELEASE_TABLET | Freq: Two times a day (BID) | ORAL | Status: AC
  Administered 2022-10-10 (×2): 40 meq via ORAL
  Filled 2022-10-10 (×2): qty 2

## 2022-10-10 MED ORDER — SODIUM CHLORIDE 0.9 % IV BOLUS
500.0000 mL | Freq: Once | INTRAVENOUS | Status: AC
  Administered 2022-10-10: 500 mL via INTRAVENOUS

## 2022-10-10 MED ORDER — ALBUTEROL SULFATE (2.5 MG/3ML) 0.083% IN NEBU
2.5000 mg | INHALATION_SOLUTION | RESPIRATORY_TRACT | Status: DC | PRN
  Administered 2022-10-11: 2.5 mg via RESPIRATORY_TRACT
  Filled 2022-10-10 (×2): qty 3

## 2022-10-10 MED ORDER — HYDROCODONE-ACETAMINOPHEN 5-325 MG PO TABS
1.0000 | ORAL_TABLET | ORAL | Status: DC | PRN
  Administered 2022-10-10: 1 via ORAL
  Administered 2022-10-10: 2 via ORAL
  Administered 2022-10-10: 1 via ORAL
  Administered 2022-10-11 (×2): 2 via ORAL
  Administered 2022-10-12 (×2): 1 via ORAL
  Administered 2022-10-13: 2 via ORAL
  Filled 2022-10-10 (×2): qty 1
  Filled 2022-10-10: qty 2
  Filled 2022-10-10 (×6): qty 1
  Filled 2022-10-10: qty 2

## 2022-10-10 MED ORDER — ALBUTEROL SULFATE (2.5 MG/3ML) 0.083% IN NEBU
2.5000 mg | INHALATION_SOLUTION | Freq: Three times a day (TID) | RESPIRATORY_TRACT | Status: DC
  Administered 2022-10-10 – 2022-10-11 (×3): 2.5 mg via RESPIRATORY_TRACT
  Filled 2022-10-10 (×4): qty 3

## 2022-10-10 NOTE — Plan of Care (Signed)
  Problem: Clinical Measurements: Goal: Will remain free from infection Outcome: Progressing Goal: Diagnostic test results will improve Outcome: Progressing   Problem: Activity: Goal: Risk for activity intolerance will decrease Outcome: Progressing   Problem: Nutrition: Goal: Adequate nutrition will be maintained Outcome: Progressing   

## 2022-10-10 NOTE — TOC Progression Note (Signed)
Transition of Care [facility]) - Progression Note    Patient Details  Name: Danielle Clay MRN: [id number] Date of Birth: Jul 08, 1950  [facility] CM/SW Contact  [name], RN Phone Number: 10/10/2022, 10:05 AM  Clinical Narrative:  Met with pt at bedside to introduce role of TOC/NCM and review for dc planning. Pt reports she has a PCP and pharmacy in place, no home care services, uses a cane as needed for functional mobility. Pt reports she will need assistance with transportation at discharge. NCM call to Bastian, SW with [facility], phone : [phone], provided clinical update, informed pt will need transportation assistance at discharge, [name] confirmed [facility] is able to provide transportation. TOC will continue to follow.      Expected Discharge Plan: Home/Self Care Barriers to Discharge: Continued Medical Work up  Expected Discharge Plan and Services       Living arrangements for the past 2 months: Single Family Home                                       Social Determinants of Health (SDOH) Interventions SDOH Screenings   Food Insecurity: No Food Insecurity (10/09/2022)  Housing: Low Risk  (10/09/2022)  Transportation Needs: No Transportation Needs (10/09/2022)  Utilities: Not At Risk (10/09/2022)  Tobacco Use: High Risk (10/09/2022)    Readmission Risk Interventions     No data to display

## 2022-10-10 NOTE — Progress Notes (Signed)
   10/10/22 0002  Assess: MEWS Score  Temp 99.4 F (37.4 C)  BP (!) 93/56  MAP (mmHg) (!) 64  Pulse Rate (!) 114  Resp (!) 24  SpO2 95 %  O2 Device Room Air  Assess: MEWS Score  MEWS Temp 0  MEWS Systolic 1  MEWS Pulse 2  MEWS RR 1  MEWS LOC 0  MEWS Score 4  MEWS Score Color Red  Assess: if the MEWS score is Yellow or Red  Were vital signs taken at a resting state? Yes  Focused Assessment No change from prior assessment  Does the patient meet 2 or more of the SIRS criteria? Yes  Does the patient have a confirmed or suspected source of infection? No  MEWS guidelines implemented  Yes, red  Treat  MEWS Interventions Considered administering scheduled or prn medications/treatments as ordered  Take Vital Signs  Increase Vital Sign Frequency  Red: Q1hr x2, continue Q4hrs until patient remains green for 12hrs  Escalate  MEWS: Escalate Red: Discuss with charge nurse and notify provider. Consider notifying RRT. If remains red for 2 hours consider need for higher level of care  Notify: Charge Nurse/RN  Name of Charge Nurse/RN Notified Myrtie Soman RN  Provider Notification  Provider Name/Title Chinita Greenland RN  Date Provider Notified 10/10/22  Time Provider Notified 0005  Method of Notification  (Secure chat)  Notification Reason Other (Comment) (red mews)  Provider response See new orders  Date of Provider Response 10/10/22  Time of Provider Response 0012  Notify: Rapid Response  Name of Rapid Response RN Notified Mandy RN  Date Rapid Response Notified 10/10/22  Time Rapid Response Notified 0016  Assess: SIRS CRITERIA  SIRS Temperature  0  SIRS Pulse 1  SIRS Respirations  1  SIRS WBC 0  SIRS Score Sum  2

## 2022-10-10 NOTE — Progress Notes (Signed)
[facility] PROGRESS NOTE    Progress Note  Danielle Clay  WUJ:[id number] DOB: 1951-03-06 DOA: 10/09/2022 PCP: Jethro Bastos, MD     Brief Narrative:   Danielle Clay is an 72 y.o. female past medical history of COPD, PAD and hypertension comes in with bloody diarrhea that started 3 days prior to admission, CT scan of the abdomen and pelvis showed severe colitis from the cecum to the distal transverse colon small volume ascites.  Cirrhosis of the liver and nonobstructive calculi bilaterally there is an adrenal nodule.     Assessment/Plan:   Sepsis due to infectious Colitis: She was fluid resuscitated started empirically on IV Cipro and Flagyl. Her leukocytosis improved.  Acute GI bleed: Not due to infectious colitis on admission, in the setting of aspirin hemoglobin 13.5, he has stabilized at 10.4. She has no further signs of bleeding.  Hypokalemia: Replete orally recheck in the morning.  COPD: Continue inhalers, no signs of exacerbation.  PAD: Holding aspirin due to GI bleed, continue atorvastatin.  Essential hypertension: Continue amlodipine hold ARB.  Insomnia: Continue Cymbalta melatonin Lyrica trazodone and Belsomra.  Cirrhosis of the liver: Incidental finding on CT with small volume ascites will need to follow-up with GI as an outpatient.  Incidental nephrolithiasis: Bilaterally asymptomatic.  Incidental adrenal nodule: Seen on CT, will need a follow-up CT scan in 12 months.  Tobacco dependence: He has been counseled patch placed    DVT prophylaxis: scd Family Communication:none Status is: Observation The patient remains OBS appropriate and will d/c before 2 midnights.    Code Status:     Code Status Orders  (From admission, onward)           Start     Ordered   10/09/22 0929  Full code  Continuous       Question:  By:  Answer:  Consent: discussion documented in EHR   10/09/22 0929           Code Status History      Date Active Date Inactive Code Status Order ID Comments User Context   06/20/2021 1007 06/20/2021 1804 Full Code [id number]  Maeola Harman, MD Inpatient         IV Access:   Peripheral IV   Procedures and diagnostic studies:   CT ABDOMEN PELVIS W CONTRAST  Result Date: 10/09/2022 CLINICAL DATA:  72 year old female with history of acute onset of nonlocalized abdominal pain. EXAM: CT ABDOMEN AND PELVIS WITH CONTRAST TECHNIQUE: Multidetector CT imaging of the abdomen and pelvis was performed using the standard protocol following bolus administration of intravenous contrast. RADIATION DOSE REDUCTION: This exam was performed according to the departmental dose-optimization program which includes automated exposure control, adjustment of the mA and/or kV according to patient size and/or use of iterative reconstruction technique. CONTRAST:  75mL OMNIPAQUE IOHEXOL 350 MG/ML SOLN COMPARISON:  No priors. FINDINGS: Lower chest: Emphysematous changes are noted throughout the visualized lung bases. Atherosclerotic calcifications in the thoracic aorta as well as the left anterior descending, left circumflex and right coronary arteries. Calcification of the aortic valve. Lipomatous hypertrophy of the interatrial septum (normal anatomical variant) incidentally noted. Small hiatal hernia. Hepatobiliary: Liver has a shrunken appearance and nodular contour, suggesting underlying cirrhosis. No discrete cystic or solid hepatic lesions are confidently identified on today's examination. Status post cholecystectomy. Pancreas: No definite pancreatic mass or peripancreatic fluid collections or inflammatory changes are noted on today's noncontrast CT examination. Spleen: Well-defined 3.8 x 3.0 cm low-attenuation lesion in the spleen has a  benign appearance, presumably a cyst (no imaging follow-up recommended). Adrenals/Urinary Tract: Nonobstructive calculi are noted within the collecting systems of both kidneys  measuring up to 6 mm in the interpolar collecting system of the right kidney. Subcentimeter low-attenuation lesions in both kidneys are too small to definitively characterize, but statistically likely to represent cysts (no imaging follow-up recommended). No aggressive appearing renal lesions. No hydroureteronephrosis. Urinary bladder is unremarkable in appearance. Left adrenal gland is normal. 1.1 x 1.0 cm right adrenal nodule (axial image 26 of series 3), incompletely characterize, but statistically likely a small adrenal adenoma. Stomach/Bowel: The appearance of the stomach is normal. No pathologic dilatation of small bowel or colon. Severe circumferential thickening of the colon extending from the level of the cecum to the distal transverse colon, with associated mucosal hyperenhancement and surrounding pericolonic inflammatory changes throughout this region, indicative of severe colitis. Normal appendix. Vascular/Lymphatic: Aortic atherosclerosis, without evidence of aneurysm or dissection in the abdominal or pelvic vasculature. No lymphadenopathy noted in the abdomen or pelvis. Reproductive: Densely calcified lesions identified in the uterus measuring up to 2.2 cm in diameter, presumably fibroids. Ovaries are atrophic and otherwise unremarkable in appearance. Other: Small volume of ascites predominantly in the low anatomic pelvis. No pneumoperitoneum. Musculoskeletal: There are no aggressive appearing lytic or blastic lesions noted in the visualized portions of the skeleton. IMPRESSION: 1. Severe colitis extending from the cecum to the distal transverse colon. 2. Small volume of ascites, presumably reactive. 3. Morphologic changes in the liver indicative of underlying cirrhosis. 4. Aortic atherosclerosis. 5. Nonobstructing renal calculi in the collecting systems of both kidneys measuring up to 6 mm in the interpolar collecting system of the right kidney. 6. 1.0 x 1.1 cm right adrenal nodule is indeterminate,  but statistically likely a small adenoma. Follow-up adrenal protocol CT scan should be considered in 12 months to re-evaluate this finding and ensure stability. This recommendation follows ACR consensus guidelines: Management of Incidental Adrenal Masses: A White Paper of the ACR Incidental Findings Committee. J Am Coll Radiol 2017;14:1038-1044. 7. Additional incidental findings, as above. Electronically Signed   By: Trudie Reed M.D.   On: 10/09/2022 05:19     Medical Consultants:   None.   Subjective:    Pablo Ledger tolerating her diet but still has abdominal pain.  Objective:    Vitals:   10/10/22 0639 10/10/22 0650 10/10/22 0817 10/10/22 1005  BP:    95/63  Pulse:    (!) 104  Resp:    16  Temp:    98.9 F (37.2 C)  TempSrc:    Oral  SpO2: 90% 91% 100% 99%  Weight:      Height:       SpO2: 99 % O2 Flow Rate (L/min): 2 L/min   Intake/Output Summary (Last 24 hours) at 10/10/2022 1014 Last data filed at 10/09/2022 2300 Gross per 24 hour  Intake 1711.2 ml  Output 70 ml  Net 1641.2 ml   Filed Weights   10/09/22 1138  Weight: 76.5 kg    Exam: General exam: In no acute distress. Respiratory system: Good air movement and clear to auscultation. Cardiovascular system: S1 & S2 heard, RRR. No JVD.  Gastrointestinal system: Abdomen is nondistended, soft and diffuse tender.  Extremities: No pedal edema. Skin: No rashes, lesions or ulcers Psychiatry: Judgement and insight appear normal. Mood & affect appropriate.    Data Reviewed:    Labs: Basic Metabolic Panel: Recent Labs  Lab 10/09/22 0320 10/10/22 0532  NA 136 137  K 2.9* 3.3*  CL 99 103  CO2 26 26  GLUCOSE 132* 121*  BUN 10 7*  CREATININE 0.72 0.45  CALCIUM 9.3 8.6*   GFR Estimated Creatinine Clearance: 63.6 mL/min (by C-G formula based on SCr of 0.45 mg/dL). Liver Function Tests: Recent Labs  Lab 10/09/22 0320  AST 19  ALT 16  ALKPHOS 45  BILITOT 0.9  PROT 6.5  ALBUMIN 3.1*   Recent  Labs  Lab 10/09/22 0320  LIPASE 22   No results for input(s): "AMMONIA" in the last 168 hours. Coagulation profile No results for input(s): "INR", "PROTIME" in the last 168 hours. COVID-19 Labs  No results for input(s): "DDIMER", "FERRITIN", "LDH", "CRP" in the last 72 hours.  Lab Results  Component Value Date   SARSCOV2NAA RESULT: NEGATIVE 07/10/2019   SARSCOV2NAA NEGATIVE 04/14/2019   SARSCOV2NAA NEGATIVE 03/05/2019    CBC: Recent Labs  Lab 10/09/22 0320 10/09/22 0443 10/09/22 1622 10/10/22 0532  WBC 41.8* 41.2* 39.0* 32.9*  NEUTROABS 35.9* 34.0*  --   --   HGB 13.5 12.7 11.8* 10.7*  HCT 41.1 38.3 36.3 33.6*  MCV 95.8 94.3 98.4 99.1  PLT 198 184 165 142*   Cardiac Enzymes: No results for input(s): "CKTOTAL", "CKMB", "CKMBINDEX", "TROPONINI" in the last 168 hours. BNP (last 3 results) No results for input(s): "PROBNP" in the last 8760 hours. CBG: No results for input(s): "GLUCAP" in the last 168 hours. D-Dimer: No results for input(s): "DDIMER" in the last 72 hours. Hgb A1c: No results for input(s): "HGBA1C" in the last 72 hours. Lipid Profile: No results for input(s): "CHOL", "HDL", "LDLCALC", "TRIG", "CHOLHDL", "LDLDIRECT" in the last 72 hours. Thyroid function studies: No results for input(s): "TSH", "T4TOTAL", "T3FREE", "THYROIDAB" in the last 72 hours.  Invalid input(s): "FREET3" Anemia work up: No results for input(s): "VITAMINB12", "FOLATE", "FERRITIN", "TIBC", "IRON", "RETICCTPCT" in the last 72 hours. Sepsis Labs: Recent Labs  Lab 10/09/22 0320 10/09/22 0321 10/09/22 0443 10/09/22 1610 10/09/22 1622 10/10/22 0532  WBC 41.8*  --  41.2*  --  39.0* 32.9*  LATICACIDVEN  --  2.4*  --  1.2  --   --    Microbiology Recent Results (from the past 240 hour(s))  C Difficile Quick Screen w PCR reflex     Status: None   Collection Time: 10/09/22  4:51 PM   Specimen: STOOL  Result Value Ref Range Status   C Diff antigen NEGATIVE NEGATIVE Final   C  Diff toxin NEGATIVE NEGATIVE Final   C Diff interpretation No C. difficile detected.  Final    Comment: Performed at [facility], [address] [place], [place] [address]     Medications:    albuterol  2.5 mg Inhalation TID   amLODipine  10 mg Oral Daily   atorvastatin  20 mg Oral QHS   fluticasone  1 spray Each Nare BID   And   azelastine  1 spray Each Nare BID   cyclobenzaprine  5 mg Oral BID   DULoxetine  60 mg Oral Daily   loratadine  10 mg Oral QPM   melatonin  3 mg Oral QHS   mometasone-formoterol  2 puff Inhalation BID   montelukast  10 mg Oral Daily   nicotine  14 mg Transdermal Daily   pantoprazole  40 mg Oral BID   pregabalin  75 mg Oral BID   sodium chloride flush  3 mL Intravenous Q12H   Suvorexant  20 mg Oral QHS   traZODone  50 mg Oral QHS   Continuous Infusions:  ciprofloxacin 400 mg (10/10/22 5784)   lactated ringers 100 mL/hr at 10/09/22 1857   metronidazole 500 mg (10/10/22 6962)      LOS: 0 days   Marinda Elk  [facility]  10/10/2022, 10:14 AM

## 2022-10-11 LAB — CBC WITH DIFFERENTIAL/PLATELET
Abs Immature Granulocytes: 0.27 10*3/uL — ABNORMAL HIGH (ref 0.00–0.07)
Basophils Absolute: 0 10*3/uL (ref 0.0–0.1)
Basophils Relative: 0 %
Eosinophils Absolute: 0.8 10*3/uL — ABNORMAL HIGH (ref 0.0–0.5)
Eosinophils Relative: 3 %
HCT: 32 % — ABNORMAL LOW (ref 36.0–46.0)
Hemoglobin: 10.1 g/dL — ABNORMAL LOW (ref 12.0–15.0)
Immature Granulocytes: 1 %
Lymphocytes Relative: 10 %
Lymphs Abs: 2.2 10*3/uL (ref 0.7–4.0)
MCH: 31.6 pg (ref 26.0–34.0)
MCHC: 31.6 g/dL (ref 30.0–36.0)
MCV: 100 fL (ref 80.0–100.0)
Monocytes Absolute: 1.7 10*3/uL — ABNORMAL HIGH (ref 0.1–1.0)
Monocytes Relative: 7 %
Neutro Abs: 18.3 10*3/uL — ABNORMAL HIGH (ref 1.7–7.7)
Neutrophils Relative %: 79 %
Platelets: 142 10*3/uL — ABNORMAL LOW (ref 150–400)
RBC: 3.2 MIL/uL — ABNORMAL LOW (ref 3.87–5.11)
RDW: 14 % (ref 11.5–15.5)
WBC: 23.3 10*3/uL — ABNORMAL HIGH (ref 4.0–10.5)
nRBC: 0 % (ref 0.0–0.2)

## 2022-10-11 LAB — BASIC METABOLIC PANEL
Anion gap: 5 (ref 5–15)
BUN: <5 mg/dL — ABNORMAL LOW (ref 8–23)
CO2: 28 mmol/L (ref 22–32)
Calcium: 8.9 mg/dL (ref 8.9–10.3)
Chloride: 102 mmol/L (ref 98–111)
Creatinine, Ser: 0.44 mg/dL (ref 0.44–1.00)
GFR, Estimated: >60 mL/min (ref >60)
Glucose, Bld: 109 mg/dL — ABNORMAL HIGH (ref 70–99)
Potassium: 3.8 mmol/L (ref 3.5–5.1)
Sodium: 135 mmol/L (ref 135–145)

## 2022-10-11 MED ORDER — METRONIDAZOLE 500 MG PO TABS
500.0000 mg | ORAL_TABLET | Freq: Two times a day (BID) | ORAL | Status: DC
  Administered 2022-10-11 – 2022-10-12 (×3): 500 mg via ORAL
  Filled 2022-10-11 (×3): qty 1

## 2022-10-11 MED ORDER — ALBUTEROL SULFATE (2.5 MG/3ML) 0.083% IN NEBU
2.5000 mg | INHALATION_SOLUTION | Freq: Two times a day (BID) | RESPIRATORY_TRACT | Status: DC
  Administered 2022-10-11 – 2022-10-12 (×3): 2.5 mg via RESPIRATORY_TRACT
  Filled 2022-10-11 (×4): qty 3

## 2022-10-11 MED ORDER — GUAIFENESIN ER 600 MG PO TB12
600.0000 mg | ORAL_TABLET | Freq: Two times a day (BID) | ORAL | Status: DC
  Administered 2022-10-11 – 2022-10-12 (×3): 600 mg via ORAL
  Filled 2022-10-11 (×3): qty 1

## 2022-10-11 MED ORDER — CIPROFLOXACIN HCL 500 MG PO TABS
500.0000 mg | ORAL_TABLET | Freq: Two times a day (BID) | ORAL | Status: DC
  Administered 2022-10-11 – 2022-10-12 (×3): 500 mg via ORAL
  Filled 2022-10-11 (×3): qty 1

## 2022-10-11 NOTE — Progress Notes (Addendum)
[facility] PROGRESS NOTE    Progress Note  Danielle Clay  [id number]:[id number] DOB: January 12, 1951 DOA: 10/09/2022 PCP: Danielle Bastos, MD     Brief Narrative:   Danielle Clay is an 72 y.o. female past medical history of COPD, PAD and hypertension colonoscopy with colonoscopy on 2023 with biopsy that showed tubular adenoma and was negative for high-grade dysplasia and carcinoma comes in with bloody diarrhea that started 3 days prior to admission, CT scan of the abdomen and pelvis showed severe colitis from the cecum to the distal transverse colon small volume ascites.  Cirrhosis of the liver and nonobstructive calculi bilaterally there is an adrenal nodule.  Assessment/Plan:   Sepsis due to infectious Colitis: Her leukocytosis improved.  CBC with differential is pending this morning. Vitals are stable has remained afebrile. She relates her diarrhea has not improved. Transition her to oral Cipro and Flagyl. Will see how she does with a diet today.  Acute GI bleed: Not due to infectious colitis on admission, in the setting of aspirin hemoglobin 13.5, he has stabilized at 10.4. She relates she had a bloody stool this morning her hemoglobin is pending.  Hypokalemia: Repleted now improved.  COPD: Continue inhalers, no signs of exacerbation.  PAD: Holding aspirin due to GI bleed, continue atorvastatin.  Essential hypertension: Continue amlodipine. Continue to hold ARB.  Insomnia: Continue Cymbalta melatonin Lyrica trazodone and Belsomra.  Cirrhosis of the liver: Incidental finding on CT with small volume ascites will need to follow-up with GI as an outpatient.  Incidental nephrolithiasis: Bilaterally asymptomatic.  Incidental adrenal nodule: Seen on CT, will need a follow-up CT scan in 12 months.  Tobacco dependence: He has been counseled patch placed    DVT prophylaxis: scd Family Communication:none Status is: Observation The patient remains OBS appropriate and  will d/c before 2 midnights.    Code Status:     Code Status Orders  (From admission, onward)           Start     Ordered   10/09/22 0929  Full code  Continuous       Question:  By:  Answer:  Consent: discussion documented in EHR   10/09/22 0929           Code Status History     Date Active Date Inactive Code Status Order ID Comments User Context   06/20/2021 1007 06/20/2021 1804 Full Code [id number]  Danielle Harman, MD Inpatient         IV Access:   Peripheral IV   Procedures and diagnostic studies:   No results found.   Medical Consultants:   None.   Subjective:    Danielle Clay still has a abdominal pain had a bloody bowel movement this morning.  Objective:    Vitals:   10/10/22 1950 10/10/22 2029 10/11/22 0510 10/11/22 0746  BP:  (!) 100/50 103/63   Pulse:  91 87   Resp:  20 20   Temp:  (!) 97.3 F (36.3 C) 98.1 F (36.7 C)   TempSrc:  Oral Oral   SpO2: 100% 100% 94% 95%  Weight:      Height:       SpO2: 95 % O2 Flow Rate (L/min): 3 L/min   Intake/Output Summary (Last 24 hours) at 10/11/2022 0842 Last data filed at 10/10/2022 1200 Gross per 24 hour  Intake 120 ml  Output --  Net 120 ml    Filed Weights   10/09/22 1138  Weight: 76.5 kg  Exam: General exam: In no acute distress. Respiratory system: Good air movement and clear to auscultation. Cardiovascular system: S1 & S2 heard, RRR. No JVD. Gastrointestinal system: Abdomen is nondistended, soft and nontender.  Extremities: No pedal edema. Skin: No rashes, lesions or ulcers Psychiatry: Judgement and insight appear normal. Mood & affect appropriate.  Data Reviewed:    Labs: Basic Metabolic Panel: Recent Labs  Lab 10/09/22 0320 10/10/22 0532 10/11/22 0534  NA 136 137 135  K 2.9* 3.3* 3.8  CL 99 103 102  CO2 26 26 28   GLUCOSE 132* 121* 109*  BUN 10 7* <5*  CREATININE 0.72 0.45 0.44  CALCIUM 9.3 8.6* 8.9    GFR Estimated Creatinine Clearance:  63.6 mL/min (by C-G formula based on SCr of 0.44 mg/dL). Liver Function Tests: Recent Labs  Lab 10/09/22 0320  AST 19  ALT 16  ALKPHOS 45  BILITOT 0.9  PROT 6.5  ALBUMIN 3.1*    Recent Labs  Lab 10/09/22 0320  LIPASE 22    No results for input(s): "AMMONIA" in the last 168 hours. Coagulation profile No results for input(s): "INR", "PROTIME" in the last 168 hours. COVID-19 Labs  No results for input(s): "DDIMER", "FERRITIN", "LDH", "CRP" in the last 72 hours.  Lab Results  Component Value Date   SARSCOV2NAA RESULT: NEGATIVE 07/10/2019   SARSCOV2NAA NEGATIVE 04/14/2019   SARSCOV2NAA NEGATIVE 03/05/2019    CBC: Recent Labs  Lab 10/09/22 0320 10/09/22 0443 10/09/22 1622 10/10/22 0532  WBC 41.8* 41.2* 39.0* 32.9*  NEUTROABS 35.9* 34.0*  --   --   HGB 13.5 12.7 11.8* 10.7*  HCT 41.1 38.3 36.3 33.6*  MCV 95.8 94.3 98.4 99.1  PLT 198 184 165 142*    Cardiac Enzymes: No results for input(s): "CKTOTAL", "CKMB", "CKMBINDEX", "TROPONINI" in the last 168 hours. BNP (last 3 results) No results for input(s): "PROBNP" in the last 8760 hours. CBG: No results for input(s): "GLUCAP" in the last 168 hours. D-Dimer: No results for input(s): "DDIMER" in the last 72 hours. Hgb A1c: No results for input(s): "HGBA1C" in the last 72 hours. Lipid Profile: No results for input(s): "CHOL", "HDL", "LDLCALC", "TRIG", "CHOLHDL", "LDLDIRECT" in the last 72 hours. Thyroid function studies: No results for input(s): "TSH", "T4TOTAL", "T3FREE", "THYROIDAB" in the last 72 hours.  Invalid input(s): "FREET3" Anemia work up: No results for input(s): "VITAMINB12", "FOLATE", "FERRITIN", "TIBC", "IRON", "RETICCTPCT" in the last 72 hours. Sepsis Labs: Recent Labs  Lab 10/09/22 0320 10/09/22 0321 10/09/22 0443 10/09/22 4540 10/09/22 1622 10/10/22 0532  WBC 41.8*  --  41.2*  --  39.0* 32.9*  LATICACIDVEN  --  2.4*  --  1.2  --   --     Microbiology Recent Results (from the past 240  hour(s))  C Difficile Quick Screen w PCR reflex     Status: None   Collection Time: 10/09/22  4:51 PM   Specimen: STOOL  Result Value Ref Range Status   C Diff antigen NEGATIVE NEGATIVE Final   C Diff toxin NEGATIVE NEGATIVE Final   C Diff interpretation No C. difficile detected.  Final    Comment: Performed at [facility], [address], [place], [place] [address]  Gastrointestinal Panel by PCR , Stool     Status: None   Collection Time: 10/09/22  4:51 PM   Specimen: STOOL  Result Value Ref Range Status   Campylobacter species NOT DETECTED NOT DETECTED Final   Plesimonas shigelloides NOT DETECTED NOT DETECTED Final   Salmonella  species NOT DETECTED NOT DETECTED Final   Yersinia enterocolitica NOT DETECTED NOT DETECTED Final   Vibrio species NOT DETECTED NOT DETECTED Final   Vibrio cholerae NOT DETECTED NOT DETECTED Final   Enteroaggregative E coli (EAEC) NOT DETECTED NOT DETECTED Final   Enteropathogenic E coli (EPEC) NOT DETECTED NOT DETECTED Final   Enterotoxigenic E coli (ETEC) NOT DETECTED NOT DETECTED Final   Shiga like toxin producing E coli (STEC) NOT DETECTED NOT DETECTED Final   Shigella/Enteroinvasive E coli (EIEC) NOT DETECTED NOT DETECTED Final   Cryptosporidium NOT DETECTED NOT DETECTED Final   Cyclospora cayetanensis NOT DETECTED NOT DETECTED Final   Entamoeba histolytica NOT DETECTED NOT DETECTED Final   Giardia lamblia NOT DETECTED NOT DETECTED Final   Adenovirus F40/41 NOT DETECTED NOT DETECTED Final   Astrovirus NOT DETECTED NOT DETECTED Final   Norovirus GI/GII NOT DETECTED NOT DETECTED Final   Rotavirus A NOT DETECTED NOT DETECTED Final   Sapovirus (I, II, IV, and V) NOT DETECTED NOT DETECTED Final    Comment: Performed at [facility], [address] [place], [place] [address]     Medications:    albuterol  2.5 mg Inhalation BID   amLODipine  10 mg Oral Daily   atorvastatin  20 mg Oral QHS   fluticasone  1 spray Each Nare  BID   And   azelastine  1 spray Each Nare BID   cyclobenzaprine  5 mg Oral BID   DULoxetine  60 mg Oral Daily   loratadine  10 mg Oral QPM   melatonin  3 mg Oral QHS   mometasone-formoterol  2 puff Inhalation BID   montelukast  10 mg Oral Daily   nicotine  14 mg Transdermal Daily   pantoprazole  40 mg Oral BID   pregabalin  75 mg Oral BID   sodium chloride flush  3 mL Intravenous Q12H   Suvorexant  20 mg Oral QHS   traZODone  50 mg Oral QHS   Continuous Infusions:  ciprofloxacin 400 mg (10/11/22 0658)   lactated ringers 10 mL/hr at 10/10/22 1043   metronidazole 500 mg (10/11/22 0651)      LOS: 1 day   Marinda Elk  [facility]  10/11/2022, 8:42 AM

## 2022-10-12 NOTE — Progress Notes (Signed)
[facility] PROGRESS NOTE    Progress Note  Danielle Clay  ZOX:[id number] DOB: 20-Jul-1950 DOA: 10/09/2022 PCP: Jethro Bastos, MD     Brief Narrative:   Danielle Clay is an 72 y.o. female past medical history of COPD, PAD and hypertension colonoscopy with colonoscopy on 2023 with biopsy that showed tubular adenoma and was negative for high-grade dysplasia and carcinoma comes in with bloody diarrhea that started 3 days prior to admission, CT scan of the abdomen and pelvis showed severe colitis from the cecum to the distal transverse colon small volume ascites.  Cirrhosis of the liver and nonobstructive calculi bilaterally there is an adrenal nodule.  Assessment/Plan:   Sepsis due to infectious Colitis: Has remained afebrile leukocytosis continues to improve. She has not tolerated her diet. Still complaining of abdominal pain her his diarrhea is improving. Continue oral pril and Flagyl.  Acute GI bleed: Due to infectious colitis on admission, in the setting of aspirin hemoglobin 13.5, he has stabilized at 10.4. She relates she had a bloody stool this morning her hemoglobin is pending.  Hypokalemia: Repleted now improved.  COPD: Continue inhalers, no signs of exacerbation.  PAD: Holding aspirin due to GI bleed, continue atorvastatin.  Essential hypertension: Continue amlodipine. Continue to hold ARB.  Insomnia: Continue Cymbalta melatonin Lyrica trazodone and Belsomra.  Cirrhosis of the liver: Incidental finding on CT with small volume ascites will need to follow-up with GI as an outpatient.  Incidental nephrolithiasis: Bilaterally asymptomatic.  Incidental adrenal nodule: Seen on CT, will need a follow-up CT scan in 12 months.  Tobacco dependence: He has been counseled patch placed    DVT prophylaxis: scd Family Communication:none Status is: Observation The patient remains OBS appropriate and will d/c before 2 midnights.    Code Status:      Code Status Orders  (From admission, onward)           Start     Ordered   10/09/22 0929  Full code  Continuous       Question:  By:  Answer:  Consent: discussion documented in EHR   10/09/22 0929           Code Status History     Date Active Date Inactive Code Status Order ID Comments User Context   06/20/2021 1007 06/20/2021 1804 Full Code [id number]  Maeola Harman, MD Inpatient         IV Access:   Peripheral IV   Procedures and diagnostic studies:   No results found.   Medical Consultants:   None.   Subjective:    Danielle Clay still having abdominal pain bloody bowel movements have improved no further bloody bowel movements.  She relates diarrhea has slowed down.  Objective:    Vitals:   10/11/22 2043 10/11/22 2100 10/12/22 0645 10/12/22 0824  BP:  (!) 142/76 131/61   Pulse:  (!) 110 (!) 102   Resp:  19 17   Temp:  99.2 F (37.3 C) 98.4 F (36.9 C)   TempSrc:  Oral Oral   SpO2: 92% 95% 93% 93%  Weight:      Height:       SpO2: 93 % O2 Flow Rate (L/min): 3 L/min   Intake/Output Summary (Last 24 hours) at 10/12/2022 0915 Last data filed at 10/12/2022 0606 Gross per 24 hour  Intake 882.03 ml  Output 0 ml  Net 882.03 ml   Filed Weights   10/09/22 1138  Weight: 76.5 kg    Exam: General exam:  In no acute distress. Respiratory system: Good air movement and clear to auscultation. Cardiovascular system: S1 & S2 heard, RRR. No JVD. Gastrointestinal system: Abdomen is nondistended, soft and nontender.  Extremities: No pedal edema. Skin: No rashes, lesions or ulcers Psychiatry: Judgement and insight appear normal. Mood & affect appropriate.  Data Reviewed:    Labs: Basic Metabolic Panel: Recent Labs  Lab 10/09/22 0320 10/10/22 0532 10/11/22 0534  NA 136 137 135  K 2.9* 3.3* 3.8  CL 99 103 102  CO2 26 26 28   GLUCOSE 132* 121* 109*  BUN 10 7* <5*  CREATININE 0.72 0.45 0.44  CALCIUM 9.3 8.6* 8.9    GFR Estimated Creatinine Clearance: 63.6 mL/min (by C-G formula based on SCr of 0.44 mg/dL). Liver Function Tests: Recent Labs  Lab 10/09/22 0320  AST 19  ALT 16  ALKPHOS 45  BILITOT 0.9  PROT 6.5  ALBUMIN 3.1*   Recent Labs  Lab 10/09/22 0320  LIPASE 22   No results for input(s): "AMMONIA" in the last 168 hours. Coagulation profile No results for input(s): "INR", "PROTIME" in the last 168 hours. COVID-19 Labs  No results for input(s): "DDIMER", "FERRITIN", "LDH", "CRP" in the last 72 hours.  Lab Results  Component Value Date   SARSCOV2NAA RESULT: NEGATIVE 07/10/2019   SARSCOV2NAA NEGATIVE 04/14/2019   SARSCOV2NAA NEGATIVE 03/05/2019    CBC: Recent Labs  Lab 10/09/22 0320 10/09/22 0443 10/09/22 1622 10/10/22 0532 10/11/22 0928  WBC 41.8* 41.2* 39.0* 32.9* 23.3*  NEUTROABS 35.9* 34.0*  --   --  18.3*  HGB 13.5 12.7 11.8* 10.7* 10.1*  HCT 41.1 38.3 36.3 33.6* 32.0*  MCV 95.8 94.3 98.4 99.1 100.0  PLT 198 184 165 142* 142*   Cardiac Enzymes: No results for input(s): "CKTOTAL", "CKMB", "CKMBINDEX", "TROPONINI" in the last 168 hours. BNP (last 3 results) No results for input(s): "PROBNP" in the last 8760 hours. CBG: No results for input(s): "GLUCAP" in the last 168 hours. D-Dimer: No results for input(s): "DDIMER" in the last 72 hours. Hgb A1c: No results for input(s): "HGBA1C" in the last 72 hours. Lipid Profile: No results for input(s): "CHOL", "HDL", "LDLCALC", "TRIG", "CHOLHDL", "LDLDIRECT" in the last 72 hours. Thyroid function studies: No results for input(s): "TSH", "T4TOTAL", "T3FREE", "THYROIDAB" in the last 72 hours.  Invalid input(s): "FREET3" Anemia work up: No results for input(s): "VITAMINB12", "FOLATE", "FERRITIN", "TIBC", "IRON", "RETICCTPCT" in the last 72 hours. Sepsis Labs: Recent Labs  Lab 10/09/22 0321 10/09/22 0443 10/09/22 0632 10/09/22 1622 10/10/22 0532 10/11/22 0928  WBC  --  41.2*  --  39.0* 32.9* 23.3*  LATICACIDVEN  2.4*  --  1.2  --   --   --    Microbiology Recent Results (from the past 240 hour(s))  C Difficile Quick Screen w PCR reflex     Status: None   Collection Time: 10/09/22  4:51 PM   Specimen: STOOL  Result Value Ref Range Status   C Diff antigen NEGATIVE NEGATIVE Final   C Diff toxin NEGATIVE NEGATIVE Final   C Diff interpretation No C. difficile detected.  Final    Comment: Performed at [facility], [address] [place], [place] [address]  Gastrointestinal Panel by PCR , Stool     Status: None   Collection Time: 10/09/22  4:51 PM   Specimen: STOOL  Result Value Ref Range Status   Campylobacter species NOT DETECTED NOT DETECTED Final   Plesimonas shigelloides NOT DETECTED NOT DETECTED Final   Salmonella  species NOT DETECTED NOT DETECTED Final   Yersinia enterocolitica NOT DETECTED NOT DETECTED Final   Vibrio species NOT DETECTED NOT DETECTED Final   Vibrio cholerae NOT DETECTED NOT DETECTED Final   Enteroaggregative E coli (EAEC) NOT DETECTED NOT DETECTED Final   Enteropathogenic E coli (EPEC) NOT DETECTED NOT DETECTED Final   Enterotoxigenic E coli (ETEC) NOT DETECTED NOT DETECTED Final   Shiga like toxin producing E coli (STEC) NOT DETECTED NOT DETECTED Final   Shigella/Enteroinvasive E coli (EIEC) NOT DETECTED NOT DETECTED Final   Cryptosporidium NOT DETECTED NOT DETECTED Final   Cyclospora cayetanensis NOT DETECTED NOT DETECTED Final   Entamoeba histolytica NOT DETECTED NOT DETECTED Final   Giardia lamblia NOT DETECTED NOT DETECTED Final   Adenovirus F40/41 NOT DETECTED NOT DETECTED Final   Astrovirus NOT DETECTED NOT DETECTED Final   Norovirus GI/GII NOT DETECTED NOT DETECTED Final   Rotavirus A NOT DETECTED NOT DETECTED Final   Sapovirus (I, II, IV, and V) NOT DETECTED NOT DETECTED Final    Comment: Performed at [facility], [address] [place], [place] [address]     Medications:    albuterol  2.5 mg Inhalation BID   amLODipine   10 mg Oral Daily   atorvastatin  20 mg Oral QHS   fluticasone  1 spray Each Nare BID   And   azelastine  1 spray Each Nare BID   ciprofloxacin  500 mg Oral BID   cyclobenzaprine  5 mg Oral BID   DULoxetine  60 mg Oral Daily   guaiFENesin  600 mg Oral BID   loratadine  10 mg Oral QPM   melatonin  3 mg Oral QHS   metroNIDAZOLE  500 mg Oral Q12H   mometasone-formoterol  2 puff Inhalation BID   montelukast  10 mg Oral Daily   nicotine  14 mg Transdermal Daily   pantoprazole  40 mg Oral BID   pregabalin  75 mg Oral BID   sodium chloride flush  3 mL Intravenous Q12H   traZODone  50 mg Oral QHS   Continuous Infusions:  lactated ringers 10 mL/hr at 10/10/22 1043      LOS: 2 days   Marinda Elk  [facility]  10/12/2022, 9:15 AM

## 2022-10-13 MED ORDER — CIPROFLOXACIN HCL 500 MG PO TABS: 500.0000 mg | ORAL_TABLET | Freq: Two times a day (BID) | ORAL | 0 refills | Status: AC

## 2022-10-13 MED ORDER — METRONIDAZOLE 500 MG PO TABS: 500.0000 mg | ORAL_TABLET | Freq: Two times a day (BID) | ORAL | 0 refills | Status: AC

## 2022-10-13 MED ORDER — TRAMADOL HCL 50 MG PO TABS: 50.0000 mg | ORAL_TABLET | Freq: Four times a day (QID) | ORAL | Status: DC | PRN

## 2022-10-13 NOTE — Discharge Summary (Addendum)
Physician Discharge Summary  Danielle Clay WGN:[id number] DOB: 1950-12-16 DOA: 10/09/2022  PCP: Jethro Bastos, MD  Admit date: 10/09/2022 Discharge date: 10/13/2022  Admitted From: Home Disposition:  Home  Recommendations for Outpatient Follow-up:  Follow up with PCP in 1-2 weeks, will need a follow-up CT in 12 months for an incidental nodule in the adrenals. Please obtain BMP/CBC in one week   Home Health:No Equipment/Devices:None  Discharge Condition:Stable CODE STATUS:Full Diet recommendation: Heart Healthy  Brief/Interim Summary:  72 y.o. female past medical history of COPD, PAD and hypertension colonoscopy with colonoscopy on 2023 with biopsy that showed tubular adenoma and was negative for high-grade dysplasia and carcinoma comes in with bloody diarrhea that started 3 days prior to admission, CT scan of the abdomen and pelvis showed severe colitis from the cecum to the distal transverse colon small volume ascites.  Cirrhosis of the liver and nonobstructive calculi bilaterally there is an adrenal nodule.   Discharge Diagnoses:  Principal Problem:   Colitis Active Problems:   Essential (primary) hypertension   COPD (chronic obstructive pulmonary disease) (HCC)   PAD (peripheral artery disease) (HCC)   Mood disorder (HCC)   Insomnia   Tobacco dependence   Infectious colitis  Severe sepsis due to infectious colitis: She start empirically on Cipro and Flagyl due to her penicillin allergy, the patient refused penicillin. She remained afebrile leukocytosis improved diarrhea resolved. She will continue Cipro and Flagyl as an outpatient for 5 additional days.  Acute GI bleed: Likely due to infectious etiology on admission her aspirin was held her hemoglobin dropped to 10.4 there were no further signs of bleeding.  Hypokalemia: Repleted now resolved.  COPD: Continue inhalers no exacerbation.  PAD: Her aspirin was held due to GI bleed she will resume it as an outpatient  she was continue on statins.  Essential hypertension: No changes made to her medication continue current regimen at home.  Insomnia: Continue Cymbalta melatonin Lyrica and trazodone.  Cirrhosis of the liver: Incidental finding on CT follow-up with PCP as an outpatient.  Incidental nephrolithiasis: Bilaterally asymptomatic.  Incidental adrenal nodule: Seen on CT Will need to follow-up with CT as an outpatient in 12 months.     Discharge Instructions  Discharge Instructions     Diet - low sodium heart healthy   Complete by: As directed    Increase activity slowly   Complete by: As directed       Allergies as of 10/13/2022       Reactions   Penicillins Swelling   Did it involve swelling of the face/tongue/throat, SOB, or low BP? Yes Did it involve sudden or severe rash/hives, skin peeling, or any reaction on the inside of your mouth or nose? Yes Did you need to seek medical attention at a hospital or doctor's office? Yes When did it last happen?      1970 If all above answers are "NO", may proceed with cephalosporin use.   Percocet [oxycodone-acetaminophen] Itching        Medication List     TAKE these medications    albuterol 108 (90 Base) MCG/ACT inhaler Commonly known as: VENTOLIN HFA Inhale 2 puffs into the lungs every 6 (six) hours as needed for wheezing or shortness of breath.   amLODipine 10 MG tablet Commonly known as: NORVASC Take 10 mg by mouth daily.   antiseptic oral rinse Liqd 15 mLs by Mouth Rinse route as needed for dry mouth.   aspirin EC 81 MG tablet Take 81 mg by mouth  daily.   atorvastatin 20 MG tablet Commonly known as: LIPITOR Take 20 mg by mouth daily.   Azelastine-Fluticasone 137-50 MCG/ACT Susp Place 1 spray into the nose in the morning and at bedtime.   Belsomra 20 MG Tabs Generic drug: Suvorexant Take 20 mg by mouth at bedtime.   Biofreeze 4 % Gel Generic drug: Menthol (Topical Analgesic) Apply 1 application. topically  2 (two) times daily as needed (pain.).   budesonide-formoterol 160-4.5 MCG/ACT inhaler Commonly known as: SYMBICORT Inhale 2 puffs into the lungs 2 (two) times daily.   cholecalciferol 25 MCG (1000 UNIT) tablet Commonly known as: VITAMIN D3 Take 1,000 Units by mouth daily.   ciprofloxacin 500 MG tablet Commonly known as: CIPRO Take 1 tablet (500 mg total) by mouth 2 (two) times daily for 5 days.   cyclobenzaprine 5 MG tablet Commonly known as: FLEXERIL Take 5 mg by mouth in the morning and at bedtime.   DULoxetine 60 MG capsule Commonly known as: CYMBALTA Take 1 capsule (60 mg total) by mouth daily.   hydroxypropyl methylcellulose / hypromellose 2.5 % ophthalmic solution Commonly known as: ISOPTO TEARS / GONIOVISC Place 1 drop into both eyes 3 (three) times daily as needed for dry eyes.   levocetirizine 5 MG tablet Commonly known as: XYZAL Take 5 mg by mouth every evening.   losartan 25 MG tablet Commonly known as: COZAAR Take 25 mg by mouth daily.   melatonin 3 MG Tabs tablet Take 3 mg by mouth at bedtime.   metroNIDAZOLE 500 MG tablet Commonly known as: FLAGYL Take 1 tablet (500 mg total) by mouth every 12 (twelve) hours for 5 days.   mineral oil-hydrophilic petrolatum ointment Apply 1 application. topically as needed for dry skin.   montelukast 10 MG tablet Commonly known as: SINGULAIR Take 10 mg by mouth daily.   Nicorette 2 MG gum Generic drug: nicotine polacrilex Take 2 mg by mouth as needed for smoking cessation.   pantoprazole 40 MG tablet Commonly known as: PROTONIX Take 40 mg by mouth in the morning and at bedtime.   pregabalin 75 MG capsule Commonly known as: LYRICA Take 75 mg by mouth 2 (two) times daily.   senna-docusate 8.6-50 MG tablet Commonly known as: Senokot-S Take 1 tablet by mouth at bedtime as needed for mild constipation.   traZODone 50 MG tablet Commonly known as: DESYREL Take 50 mg by mouth at bedtime.        Allergies   Allergen Reactions   Penicillins Swelling    Did it involve swelling of the face/tongue/throat, SOB, or low BP? Yes Did it involve sudden or severe rash/hives, skin peeling, or any reaction on the inside of your mouth or nose? Yes Did you need to seek medical attention at a hospital or doctor's office? Yes When did it last happen?      1970 If all above answers are "NO", may proceed with cephalosporin use.    Percocet [Oxycodone-Acetaminophen] Itching    Consultations: None  Procedures/Studies: CT ABDOMEN PELVIS W CONTRAST  Result Date: 10/09/2022 CLINICAL DATA:  72 year old female with history of acute onset of nonlocalized abdominal pain. EXAM: CT ABDOMEN AND PELVIS WITH CONTRAST TECHNIQUE: Multidetector CT imaging of the abdomen and pelvis was performed using the standard protocol following bolus administration of intravenous contrast. RADIATION DOSE REDUCTION: This exam was performed according to the departmental dose-optimization program which includes automated exposure control, adjustment of the mA and/or kV according to patient size and/or use of iterative reconstruction technique. CONTRAST:  75mL OMNIPAQUE IOHEXOL 350 MG/ML SOLN COMPARISON:  No priors. FINDINGS: Lower chest: Emphysematous changes are noted throughout the visualized lung bases. Atherosclerotic calcifications in the thoracic aorta as well as the left anterior descending, left circumflex and right coronary arteries. Calcification of the aortic valve. Lipomatous hypertrophy of the interatrial septum (normal anatomical variant) incidentally noted. Small hiatal hernia. Hepatobiliary: Liver has a shrunken appearance and nodular contour, suggesting underlying cirrhosis. No discrete cystic or solid hepatic lesions are confidently identified on today's examination. Status post cholecystectomy. Pancreas: No definite pancreatic mass or peripancreatic fluid collections or inflammatory changes are noted on today's noncontrast CT  examination. Spleen: Well-defined 3.8 x 3.0 cm low-attenuation lesion in the spleen has a benign appearance, presumably a cyst (no imaging follow-up recommended). Adrenals/Urinary Tract: Nonobstructive calculi are noted within the collecting systems of both kidneys measuring up to 6 mm in the interpolar collecting system of the right kidney. Subcentimeter low-attenuation lesions in both kidneys are too small to definitively characterize, but statistically likely to represent cysts (no imaging follow-up recommended). No aggressive appearing renal lesions. No hydroureteronephrosis. Urinary bladder is unremarkable in appearance. Left adrenal gland is normal. 1.1 x 1.0 cm right adrenal nodule (axial image 26 of series 3), incompletely characterize, but statistically likely a small adrenal adenoma. Stomach/Bowel: The appearance of the stomach is normal. No pathologic dilatation of small bowel or colon. Severe circumferential thickening of the colon extending from the level of the cecum to the distal transverse colon, with associated mucosal hyperenhancement and surrounding pericolonic inflammatory changes throughout this region, indicative of severe colitis. Normal appendix. Vascular/Lymphatic: Aortic atherosclerosis, without evidence of aneurysm or dissection in the abdominal or pelvic vasculature. No lymphadenopathy noted in the abdomen or pelvis. Reproductive: Densely calcified lesions identified in the uterus measuring up to 2.2 cm in diameter, presumably fibroids. Ovaries are atrophic and otherwise unremarkable in appearance. Other: Small volume of ascites predominantly in the low anatomic pelvis. No pneumoperitoneum. Musculoskeletal: There are no aggressive appearing lytic or blastic lesions noted in the visualized portions of the skeleton. IMPRESSION: 1. Severe colitis extending from the cecum to the distal transverse colon. 2. Small volume of ascites, presumably reactive. 3. Morphologic changes in the liver  indicative of underlying cirrhosis. 4. Aortic atherosclerosis. 5. Nonobstructing renal calculi in the collecting systems of both kidneys measuring up to 6 mm in the interpolar collecting system of the right kidney. 6. 1.0 x 1.1 cm right adrenal nodule is indeterminate, but statistically likely a small adenoma. Follow-up adrenal protocol CT scan should be considered in 12 months to re-evaluate this finding and ensure stability. This recommendation follows ACR consensus guidelines: Management of Incidental Adrenal Masses: A White Paper of the ACR Incidental Findings Committee. J Am Coll Radiol 2017;14:1038-1044. 7. Additional incidental findings, as above. Electronically Signed   By: Trudie Reed M.D.   On: 10/09/2022 05:19   VAS [place] ABI WITH/WO TBI  Result Date: 10/04/2022  LOWER EXTREMITY DOPPLER STUDY Patient Name:  Danielle Clay  Date of Exam:   10/04/2022 Medical Rec #: [id number]       Accession #:    [id number] Date of Birth: Mar 10, 1951      Patient Gender: F Patient Age:   11 years Exam Location:  [facility] Procedure:      VAS [place] ABI WITH/WO TBI Referring Phys: Lemar Livings --------------------------------------------------------------------------------  Indications: Peripheral artery disease. Follow up bilateral EIA stents High Risk Factors: Hypertension, past history of smoking.  Vascular Interventions: 06/20/21: Stent of right external iliac  artery with                         6 x 60 mm Eluvia Stent of left external iliac artery                         with a 4 x 60 mm Eluvia. Comparison Study: 01/11/2022 ABI/TBI- right=0.74/0.48, left=0.64/0.47 Performing Technologist: Gertie Fey MHA, RVT, RDCS, RDMS  Examination Guidelines: A complete evaluation includes at minimum, Doppler waveform signals and systolic blood pressure reading at the level of bilateral brachial, anterior tibial, and posterior tibial arteries, when vessel segments are accessible. Bilateral testing is  considered an integral part of a complete examination. Photoelectric Plethysmograph (PPG) waveforms and toe systolic pressure readings are included as required and additional duplex testing as needed. Limited examinations for reoccurring indications may be performed as noted.  ABI Findings: +---------+------------------+-----+----------+--------+ Right    Rt Pressure (mmHg)IndexWaveform  Comment  +---------+------------------+-----+----------+--------+ Brachial 142                                       +---------+------------------+-----+----------+--------+ PTA      103               0.73 monophasic         +---------+------------------+-----+----------+--------+ DP       87                0.61 monophasic         +---------+------------------+-----+----------+--------+ Great Toe58                0.41                    +---------+------------------+-----+----------+--------+ +---------+------------------+-----+----------+-------+ Left     Lt Pressure (mmHg)IndexWaveform  Comment +---------+------------------+-----+----------+-------+ Brachial 137                                      +---------+------------------+-----+----------+-------+ PTA      91                0.64 monophasic        +---------+------------------+-----+----------+-------+ DP       100               0.70 biphasic          +---------+------------------+-----+----------+-------+ Great Toe79                0.56                   +---------+------------------+-----+----------+-------+ +-------+-----------+-----------+------------+------------+ ABI/TBIToday's ABIToday's TBIPrevious ABIPrevious TBI +-------+-----------+-----------+------------+------------+ Right  0.73       0.41       0.74        0.48         +-------+-----------+-----------+------------+------------+ Left   0.70       0.56       0.64        0.47          +-------+-----------+-----------+------------+------------+  Bilateral ABIs and TBIs appear essentially unchanged compared to prior study on 01/11/2022.  Summary: Right: Resting right ankle-brachial index indicates moderate right lower extremity arterial disease. The right toe-brachial index is abnormal. Left: Resting left ankle-brachial index indicates moderate left lower extremity arterial disease. The left toe-brachial index is  abnormal. *See table(s) above for measurements and observations.  Electronically signed by Lemar Livings MD on 10/04/2022 at 2:40:45 PM.    Final    VAS US AORTA/IVC/ILIACS  Result Date: 10/04/2022 ABDOMINAL AORTA STUDY Patient Name:  Danielle Clay  Date of Exam:   10/04/2022 Medical Rec #: [id number]       Accession #:    [id number] Date of Birth: 1950/10/26      Patient Gender: F Patient Age:   72 years Exam Location:  [facility] Procedure:      VAS US AORTA/IVC/ILIACS Referring Phys: Lemar Livings --------------------------------------------------------------------------------  Indications: Follow up bilateral external iliac artery stenting Risk Factors: Hypertension, past history of smoking. Vascular Interventions: 06/20/21: Stent of right external iliac artery with 6 x                         60 mm Eluvia Stent of left external iliac artery with a                         4 x 60 mm Eluvia. Limitations: Air/bowel gas, obesity and patient discomfort.  Comparison Study: 12/08/21 Aorta/iliac duplex- Summary:                   Stenosis: Bilateral EIA stents appear patent with no stenosis. Performing Technologist: Gertie Fey MHA, RDMS, RVT, RDCS  Examination Guidelines: A complete evaluation includes B-mode imaging, spectral Doppler, color Doppler, and power Doppler as needed of all accessible portions of each vessel. Bilateral testing is considered an integral part of a complete examination. Limited examinations for reoccurring indications may be performed as noted.   Right Stent(s): +---------------+--------+--------+---------+--------+ EIA            PSV cm/sStenosisWaveform Comments +---------------+--------+--------+---------+--------+ Prox to Stent  98              triphasic         +---------------+--------+--------+---------+--------+ Proximal Stent 170             triphasic         +---------------+--------+--------+---------+--------+ Mid Stent      164             triphasic         +---------------+--------+--------+---------+--------+ Distal Stent   164             triphasic         +---------------+--------+--------+---------+--------+ Distal to Stent128             triphasic         +---------------+--------+--------+---------+--------+   Left Stent(s): +---------------+--------+--------+----------+--------+ EIA            PSV cm/sStenosisWaveform  Comments +---------------+--------+--------+----------+--------+ Prox to Stent  140             biphasic           +---------------+--------+--------+----------+--------+ Proximal Stent 148             biphasic           +---------------+--------+--------+----------+--------+ Mid Stent      124             biphasic           +---------------+--------+--------+----------+--------+ Distal Stent   138             biphasic           +---------------+--------+--------+----------+--------+ Distal to Stent84  monophasic         +---------------+--------+--------+----------+--------+    Summary: Stenosis: +--------------------+--------------+ Location            Stent          +--------------------+--------------+ Right External Iliac1-49% stenosis +--------------------+--------------+ Left External Iliac 1-49% stenosis +--------------------+--------------+   *See table(s) above for measurements and observations.  Electronically signed by Lemar Livings MD on 10/04/2022 at 2:40:39 PM.    Final    (Echo, Carotid, EGD, Colonoscopy, ERCP)     Subjective:   Discharge Exam: Vitals:   10/12/22 2238 10/13/22 0516  BP: (!) 108/54 130/65  Pulse: 95 99  Resp: 16 16  Temp: 98.3 F (36.8 C) 98.7 F (37.1 C)  SpO2: 97% 93%   Vitals:   10/12/22 1404 10/12/22 1958 10/12/22 2238 10/13/22 0516  BP: 117/64  (!) 108/54 130/65  Pulse: (!) 107  95 99  Resp: 20  16 16   Temp: 98.4 F (36.9 C)  98.3 F (36.8 C) 98.7 F (37.1 C)  TempSrc: Oral  Oral Oral  SpO2: 96% 93% 97% 93%  Weight:      Height:        General: Pt is alert, awake, not in acute distress Cardiovascular: RRR, S1/S2 +, no rubs, no gallops Respiratory: CTA bilaterally, no wheezing, no rhonchi Abdominal: Soft, NT, ND, bowel sounds + Extremities: no edema, no cyanosis    The results of significant diagnostics from this hospitalization (including imaging, microbiology, ancillary and laboratory) are listed below for reference.     Microbiology: Recent Results (from the past 240 hour(s))  C Difficile Quick Screen w PCR reflex     Status: None   Collection Time: 10/09/22  4:51 PM   Specimen: STOOL  Result Value Ref Range Status   C Diff antigen NEGATIVE NEGATIVE Final   C Diff toxin NEGATIVE NEGATIVE Final   C Diff interpretation No C. difficile detected.  Final    Comment: Performed at [facility], [address] [place], [place] [address]  Gastrointestinal Panel by PCR , Stool     Status: None   Collection Time: 10/09/22  4:51 PM   Specimen: STOOL  Result Value Ref Range Status   Campylobacter species NOT DETECTED NOT DETECTED Final   Plesimonas shigelloides NOT DETECTED NOT DETECTED Final   Salmonella species NOT DETECTED NOT DETECTED Final   Yersinia enterocolitica NOT DETECTED NOT DETECTED Final   Vibrio species NOT DETECTED NOT DETECTED Final   Vibrio cholerae NOT DETECTED NOT DETECTED Final   Enteroaggregative E coli (EAEC) NOT DETECTED NOT DETECTED Final   Enteropathogenic E coli (EPEC) NOT DETECTED NOT DETECTED Final    Enterotoxigenic E coli (ETEC) NOT DETECTED NOT DETECTED Final   Shiga like toxin producing E coli (STEC) NOT DETECTED NOT DETECTED Final   Shigella/Enteroinvasive E coli (EIEC) NOT DETECTED NOT DETECTED Final   Cryptosporidium NOT DETECTED NOT DETECTED Final   Cyclospora cayetanensis NOT DETECTED NOT DETECTED Final   Entamoeba histolytica NOT DETECTED NOT DETECTED Final   Giardia lamblia NOT DETECTED NOT DETECTED Final   Adenovirus F40/41 NOT DETECTED NOT DETECTED Final   Astrovirus NOT DETECTED NOT DETECTED Final   Norovirus GI/GII NOT DETECTED NOT DETECTED Final   Rotavirus A NOT DETECTED NOT DETECTED Final   Sapovirus (I, II, IV, and V) NOT DETECTED NOT DETECTED Final    Comment: Performed at [facility], [address] [place], [place] [address]     Labs: BNP (last 3 results) No  results for input(s): "BNP" in the last 8760 hours. Basic Metabolic Panel: Recent Labs  Lab 10/09/22 0320 10/10/22 0532 10/11/22 0534  NA 136 137 135  K 2.9* 3.3* 3.8  CL 99 103 102  CO2 26 26 28   GLUCOSE 132* 121* 109*  BUN 10 7* <5*  CREATININE 0.72 0.45 0.44  CALCIUM 9.3 8.6* 8.9   Liver Function Tests: Recent Labs  Lab 10/09/22 0320  AST 19  ALT 16  ALKPHOS 45  BILITOT 0.9  PROT 6.5  ALBUMIN 3.1*   Recent Labs  Lab 10/09/22 0320  LIPASE 22   No results for input(s): "AMMONIA" in the last 168 hours. CBC: Recent Labs  Lab 10/09/22 0320 10/09/22 0443 10/09/22 1622 10/10/22 0532 10/11/22 0928  WBC 41.8* 41.2* 39.0* 32.9* 23.3*  NEUTROABS 35.9* 34.0*  --   --  18.3*  HGB 13.5 12.7 11.8* 10.7* 10.1*  HCT 41.1 38.3 36.3 33.6* 32.0*  MCV 95.8 94.3 98.4 99.1 100.0  PLT 198 184 165 142* 142*   Cardiac Enzymes: No results for input(s): "CKTOTAL", "CKMB", "CKMBINDEX", "TROPONINI" in the last 168 hours. BNP: Invalid input(s): "POCBNP" CBG: No results for input(s): "GLUCAP" in the last 168 hours. D-Dimer No results for input(s): "DDIMER" in the last 72 hours. Hgb  A1c No results for input(s): "HGBA1C" in the last 72 hours. Lipid Profile No results for input(s): "CHOL", "HDL", "LDLCALC", "TRIG", "CHOLHDL", "LDLDIRECT" in the last 72 hours. Thyroid function studies No results for input(s): "TSH", "T4TOTAL", "T3FREE", "THYROIDAB" in the last 72 hours.  Invalid input(s): "FREET3" Anemia work up No results for input(s): "VITAMINB12", "FOLATE", "FERRITIN", "TIBC", "IRON", "RETICCTPCT" in the last 72 hours. Urinalysis No results found for: "COLORURINE", "APPEARANCEUR", "LABSPEC", "PHURINE", "GLUCOSEU", "HGBUR", "BILIRUBINUR", "KETONESUR", "PROTEINUR", "UROBILINOGEN", "NITRITE", "LEUKOCYTESUR" Sepsis Labs Recent Labs  Lab 10/09/22 0443 10/09/22 1622 10/10/22 0532 10/11/22 0928  WBC 41.2* 39.0* 32.9* 23.3*   Microbiology Recent Results (from the past 240 hour(s))  C Difficile Quick Screen w PCR reflex     Status: None   Collection Time: 10/09/22  4:51 PM   Specimen: STOOL  Result Value Ref Range Status   C Diff antigen NEGATIVE NEGATIVE Final   C Diff toxin NEGATIVE NEGATIVE Final   C Diff interpretation No C. difficile detected.  Final    Comment: Performed at [facility], [address] [place], [place] [address]  Gastrointestinal Panel by PCR , Stool     Status: None   Collection Time: 10/09/22  4:51 PM   Specimen: STOOL  Result Value Ref Range Status   Campylobacter species NOT DETECTED NOT DETECTED Final   Plesimonas shigelloides NOT DETECTED NOT DETECTED Final   Salmonella species NOT DETECTED NOT DETECTED Final   Yersinia enterocolitica NOT DETECTED NOT DETECTED Final   Vibrio species NOT DETECTED NOT DETECTED Final   Vibrio cholerae NOT DETECTED NOT DETECTED Final   Enteroaggregative E coli (EAEC) NOT DETECTED NOT DETECTED Final   Enteropathogenic E coli (EPEC) NOT DETECTED NOT DETECTED Final   Enterotoxigenic E coli (ETEC) NOT DETECTED NOT DETECTED Final   Shiga like toxin producing E coli (STEC) NOT DETECTED  NOT DETECTED Final   Shigella/Enteroinvasive E coli (EIEC) NOT DETECTED NOT DETECTED Final   Cryptosporidium NOT DETECTED NOT DETECTED Final   Cyclospora cayetanensis NOT DETECTED NOT DETECTED Final   Entamoeba histolytica NOT DETECTED NOT DETECTED Final   Giardia lamblia NOT DETECTED NOT DETECTED Final   Adenovirus F40/41 NOT DETECTED NOT DETECTED Final   Astrovirus  NOT DETECTED NOT DETECTED Final   Norovirus GI/GII NOT DETECTED NOT DETECTED Final   Rotavirus A NOT DETECTED NOT DETECTED Final   Sapovirus (I, II, IV, and V) NOT DETECTED NOT DETECTED Final    Comment: Performed at [facility], [address] [place], [place] [address]     Time coordinating discharge: Over 30 minutes  SIGNED:   Marinda Elk, MD  [facility] 10/13/2022, 8:31 AM Pager   If 7PM-7AM, please contact night-coverage [email or web address] Password [email or web address]

## 2022-10-13 NOTE — Plan of Care (Signed)

## 2022-10-13 NOTE — Care Management Important Message (Signed)
Important Message  Patient Details IM Letter given. Name: Danielle Clay MRN: [id number] Date of Birth: 1950/05/20   Medicare Important Message Given:  Yes     Caren Macadam 10/13/2022, 9:36 AM

## 2022-10-13 NOTE — TOC Transition Note (Signed)
Transition of Care [facility]) - CM/SW Discharge Note   Patient Details  Name: Danielle Clay MRN: [id number] Date of Birth: May 26, 1950  Transition of Care [facility]) CM/SW Contact:  Adrian Prows, RN Phone Number: 10/13/2022, 9:53 AM   Clinical Narrative:    D/C orders received; contacted [name], SW at [facility] [place]; she spoke w/ transportation and gave a pick up time of 12 noon; pt will be picked up from the room; [name] also requested d/c summary be faxed to Dr. Judie Petit, clinic MD [phone]); electronic confirmation received; Inetta Fermo, RN given pick up time; she will notify pt; no TOC needs.  Final next level of care: Home/Self Care Barriers to Discharge: No Barriers Identified   Patient Goals and CMS Choice      Discharge Placement                         Discharge Plan and Services Additional resources added to the After Visit Summary for                                       Social Determinants of Health (SDOH) Interventions SDOH Screenings   Food Insecurity: No Food Insecurity (10/09/2022)  Housing: Low Risk  (10/09/2022)  Transportation Needs: No Transportation Needs (10/09/2022)  Utilities: Not At Risk (10/09/2022)  Tobacco Use: High Risk (10/09/2022)     Readmission Risk Interventions     No data to display

## 2022-10-13 NOTE — Plan of Care (Signed)

## 2022-10-20 ENCOUNTER — Other Ambulatory Visit: Payer: Self-pay

## 2022-10-20 DIAGNOSIS — I739 Peripheral vascular disease, unspecified: Secondary | ICD-10-CM

## 2022-10-20 DIAGNOSIS — I70223 Atherosclerosis of native arteries of extremities with rest pain, bilateral legs: Secondary | ICD-10-CM

## 2022-11-20 ENCOUNTER — Other Ambulatory Visit: Payer: Self-pay | Admitting: Vascular Surgery

## 2022-11-20 DIAGNOSIS — Z79899 Other long term (current) drug therapy: Secondary | ICD-10-CM

## 2023-05-25 ENCOUNTER — Other Ambulatory Visit: Payer: Medicare (Managed Care)
